# Patient Record
Sex: Male | Born: 1955 | Race: Black or African American | Hispanic: No | Smoking: Current some day smoker
Health system: Southern US, Community
[De-identification: ages and names within clinical notes are randomized; demographics above are authoritative.]

## PROBLEM LIST (undated history)

## (undated) DIAGNOSIS — T7840XA Allergy, unspecified, initial encounter: Secondary | ICD-10-CM

## (undated) DIAGNOSIS — I10 Essential (primary) hypertension: Secondary | ICD-10-CM

## (undated) DIAGNOSIS — J449 Chronic obstructive pulmonary disease, unspecified: Secondary | ICD-10-CM

## (undated) HISTORY — PX: NO PAST SURGERIES: SHX2092

## (undated) HISTORY — DX: Essential (primary) hypertension: I10

## (undated) HISTORY — DX: Chronic obstructive pulmonary disease, unspecified: J44.9

## (undated) HISTORY — DX: Allergy, unspecified, initial encounter: T78.40XA

---

## 1999-07-11 ENCOUNTER — Ambulatory Visit (HOSPITAL_BASED_OUTPATIENT_CLINIC_OR_DEPARTMENT_OTHER): Admission: RE | Admit: 1999-07-11 | Discharge: 1999-07-11 | Payer: Self-pay | Admitting: Otolaryngology

## 2004-02-25 ENCOUNTER — Emergency Department (HOSPITAL_COMMUNITY): Admission: EM | Admit: 2004-02-25 | Discharge: 2004-02-25 | Payer: Self-pay | Admitting: Emergency Medicine

## 2005-03-27 ENCOUNTER — Emergency Department (HOSPITAL_COMMUNITY): Admission: EM | Admit: 2005-03-27 | Discharge: 2005-03-27 | Payer: Self-pay | Admitting: Emergency Medicine

## 2005-04-16 ENCOUNTER — Emergency Department (HOSPITAL_COMMUNITY): Admission: EM | Admit: 2005-04-16 | Discharge: 2005-04-16 | Payer: Self-pay | Admitting: Emergency Medicine

## 2006-09-19 ENCOUNTER — Emergency Department (HOSPITAL_COMMUNITY): Admission: EM | Admit: 2006-09-19 | Discharge: 2006-09-19 | Payer: Self-pay | Admitting: Emergency Medicine

## 2006-11-05 ENCOUNTER — Emergency Department (HOSPITAL_COMMUNITY): Admission: EM | Admit: 2006-11-05 | Discharge: 2006-11-05 | Payer: Self-pay | Admitting: Emergency Medicine

## 2007-03-15 ENCOUNTER — Ambulatory Visit: Payer: Self-pay | Admitting: Family Medicine

## 2007-03-18 ENCOUNTER — Ambulatory Visit: Payer: Self-pay | Admitting: *Deleted

## 2007-09-16 ENCOUNTER — Ambulatory Visit: Payer: Self-pay | Admitting: Family Medicine

## 2008-03-30 ENCOUNTER — Ambulatory Visit: Payer: Self-pay | Admitting: Internal Medicine

## 2008-07-30 ENCOUNTER — Ambulatory Visit: Payer: Self-pay | Admitting: Family Medicine

## 2008-08-24 ENCOUNTER — Emergency Department (HOSPITAL_COMMUNITY): Admission: EM | Admit: 2008-08-24 | Discharge: 2008-08-24 | Payer: Self-pay | Admitting: Emergency Medicine

## 2008-08-24 ENCOUNTER — Emergency Department (HOSPITAL_COMMUNITY): Admission: EM | Admit: 2008-08-24 | Discharge: 2008-08-25 | Payer: Self-pay | Admitting: Emergency Medicine

## 2008-10-22 ENCOUNTER — Ambulatory Visit: Payer: Self-pay | Admitting: Family Medicine

## 2009-06-24 ENCOUNTER — Ambulatory Visit: Payer: Self-pay | Admitting: Family Medicine

## 2009-12-28 ENCOUNTER — Telehealth (INDEPENDENT_AMBULATORY_CARE_PROVIDER_SITE_OTHER): Payer: Self-pay | Admitting: *Deleted

## 2009-12-30 ENCOUNTER — Ambulatory Visit: Payer: Self-pay | Admitting: Family Medicine

## 2010-07-26 NOTE — Progress Notes (Signed)
Summary: triage/headache/body aches  Phone Note Call from Patient   Caller: Patient Reason for Call: Talk to Nurse Summary of Call: patient states he has been having chills/headache and body aches. He is not aware of being around anyone that has been sick...he has a history of asthma and has a slight cough and his inhalers are helping..he denies any nausea, vomiting diarrhea or sore throat..Advised he to take ibuprofen if not allergic. Drink plenty of fluids and get rest..Call tomorrow if no improvement or worsening s/s. Initial call taken by: Conchita Paris,  December 28, 2009 6:26 PM

## 2010-11-11 NOTE — Op Note (Signed)
[place]. [facility]  Patient:    JONH MCQUEARY                          MRN: [id number] Proc. Date: 07/11/99 Adm. Date:  [id number] Attending:  Susy Frizzle CC:         Barbette Hair. Vaughan Basta., M.D.                           Operative Report  PREOPERATIVE DIAGNOSIS:  Epidermoid cyst of the anterior neck.  POSTOPERATIVE DIAGNOSIS:  Epidermoid cyst of the anterior neck.  PROCEDURE:  Excision of epidermoid cyst of neck.  SURGEON:  Jefry H. Pollyann Kennedy, M.D.  ANESTHESIA:  General endotracheal anesthesia.  COMPLICATIONS:  None.  FINDINGS:  Firm, cystic mass at the midline anterior upper cervical area attached to the overlying skin, total area of induration approximately 3.5 cm.  Dr. Ike Bene is the referring physician.   The patient tolerated the procedure well, was awakened, extubated and transferred to recovery in stable condition.  INDICATIONS:  A 55 year old with a history of recurrent infection in what appears to be an epidermoid cyst of the anterior cervical skin.  Risks, benefits, alternatives, complications of the procedure explained to the patient, who seemed to understand and agreed to surgery.  PROCEDURE:  The patient was taken to the operating room, placed on the operating table in the supine position.  Following induction of general endotracheal anesthesia, the area was prepped and draped in the standard fashion.  The proposed incision was outlined with a marking pen and local anesthetic was infiltrated - 1% Xylocaine with epinephrine.  A 15 scalpel was used to remove a horizontal, elongated ellipse of skin encompassing all of the apparent scar tissue in the dermis.   Careful sharp dissection was then used to follow the indurated tissue  around.  The area of dissection seemed to be more anterior and inferior than the marginal branch of the facial nerve and the nerve was not identified.  Careful dissection was accomplished  surrounding the entire lesion, which was then removed and sent for pathologic evaluation.  The wound was irrigated and electrocautery was used to cauterize several small bleeding sites.  A rubberband drain was secured in place in the wound and a interrupted 5-0 Prolene suture was used to reapproximate the skin edges.  A dressing was applied.  Patient was then awakened, extubated nd transferred to recovery. DD:  07/11/99 TD:  07/11/99 Job: [id number]

## 2011-01-08 ENCOUNTER — Emergency Department (HOSPITAL_COMMUNITY)
Admission: EM | Admit: 2011-01-08 | Discharge: 2011-01-08 | Disposition: A | Discharge status: Home / Self Care | Payer: Self-pay | Attending: Emergency Medicine | Admitting: Emergency Medicine

## 2011-01-08 DIAGNOSIS — T394X1A Poisoning by antirheumatics, not elsewhere classified, accidental (unintentional), initial encounter: Secondary | ICD-10-CM | POA: Insufficient documentation

## 2011-01-08 DIAGNOSIS — T39314A Poisoning by propionic acid derivatives, undetermined, initial encounter: Secondary | ICD-10-CM | POA: Insufficient documentation

## 2011-01-08 DIAGNOSIS — K089 Disorder of teeth and supporting structures, unspecified: Secondary | ICD-10-CM | POA: Insufficient documentation

## 2011-01-08 LAB — POCT I-STAT, CHEM 8
Creatinine, Ser: 1.3 mg/dL (ref 0.50–1.35)
HCT: 44 % (ref 39.0–52.0)
Hemoglobin: 15 g/dL (ref 13.0–17.0)
Potassium: 3.9 mEq/L (ref 3.5–5.1)
Sodium: 142 mEq/L (ref 135–145)
TCO2: 30 mmol/L (ref 0–100)

## 2011-11-27 ENCOUNTER — Emergency Department (HOSPITAL_COMMUNITY)
Admission: EM | Admit: 2011-11-27 | Discharge: 2011-11-27 | Disposition: A | Discharge status: Home / Self Care | Payer: Self-pay | Source: Home / Self Care | Attending: Emergency Medicine | Admitting: Emergency Medicine

## 2011-11-27 ENCOUNTER — Encounter (HOSPITAL_COMMUNITY): Payer: Self-pay | Admitting: *Deleted

## 2011-11-27 DIAGNOSIS — A4902 Methicillin resistant Staphylococcus aureus infection, unspecified site: Secondary | ICD-10-CM

## 2011-11-27 MED ORDER — MUPIROCIN 2 % EX OINT: TOPICAL_OINTMENT | Freq: Three times a day (TID) | CUTANEOUS | Status: AC

## 2011-11-27 MED ORDER — SULFAMETHOXAZOLE-TMP DS 800-160 MG PO TABS: 2.0000 | ORAL_TABLET | Freq: Two times a day (BID) | ORAL | Status: AC

## 2011-11-27 NOTE — ED Notes (Signed)
Pt  Noticed      A few  Days  Ago  A   Lesion  On his  abd  Which  He      Thought  May  Have been a  Gaffer  -  It is  Getting  Worse  He  Reports   And  Now  Has  A  Yellow  Core     With  Redness     Present

## 2011-11-27 NOTE — ED Provider Notes (Signed)
Chief Complaint  Patient presents with  . Recurrent Skin Infections    History of Present Illness:   The patient is a 56 year old male who has had a five-day history of a tender, swollen, blistered placed in the left upper quadrant of his abdomen. He attributed this to an insect bite, but did not see anything biting or stinging him. It's tender to touch. He denies any fever or chills. There's been no drainage. Never had anything like this before. He has no history of MRSA or diabetes.  Review of Systems:  Other than noted above, the patient denies any of the following symptoms: Systemic:  No fever, chills, sweats, weight loss, or fatigue. ENT:  No nasal congestion, rhinorrhea, sore throat, swelling of lips, tongue or throat. Resp:  No cough, wheezing, or shortness of breath. Skin:  No rash, itching, nodules, or suspicious lesions.  PMFSH:  Past medical history, family history, social history, meds, and allergies were reviewed.  Physical Exam:   Vital signs:  BP 153/86  Pulse 89  Temp(Src) 98.3 F (36.8 C) (Oral)  Resp 16  SpO2 100% Gen:  Alert, oriented, in no distress. ENT:  Pharynx clear, no intraoral lesions, moist mucous membranes. Lungs:  Clear to auscultation. Skin:  There was a 2 cm x 2 cm raised, red papule on the left upper quadrant of his abdomen with a small pustule overlying it. There was no fluctuance.  Other Labs Obtained at Urgent Care Center:  The pustule was cultured.  Results are pending at this time and we will call about any positive results.  Assessment:  The encounter diagnosis was MRSA infection (methicillin-resistant Staphylococcus aureus).  Plan:   1.  The following meds were prescribed:   New Prescriptions   MUPIROCIN OINTMENT (BACTROBAN) 2 %    Apply topically 3 (three) times daily.   SULFAMETHOXAZOLE-TRIMETHOPRIM (BACTRIM DS) 800-160 MG PER TABLET    Take 2 tablets by mouth 2 (two) times daily.   2.  The patient was instructed in symptomatic care and  handouts were given. 3.  The patient was told to return if becoming worse in any way, if no better in 3 or 4 days, and given some red flag symptoms that would indicate earlier return.     Reuben Likes, MD 11/27/11 2127

## 2011-11-27 NOTE — Discharge Instructions (Signed)
Community-Associated MRSA CA-MRSA stands for community-associated methicillin-resistant Staphylococcus aureus. MRSA is a type of bacteria that is resistant to some common antibiotics. It can cause infections in the skin and many other places in the body. Staphylococcus aureus, often called "staph," is a bacteria that normally lives on the skin or in the nose. Staph on the surface of the skin or in the nose does not cause problems. However, if the staph enters the body through a cut, wound, or break in the skin, an infection can happen. Up until recently, infections with the MRSA type of staph mainly occurred in hospitals and other healthcare settings. There are now increasing problems with MRSA infections in the community as well. Infections with MRSA may be very serious or even life-threatening. CA-MRSA is becoming more common. It is known to spread in crowded settings, in jails and prisons, and in situations where there is close skin-to-skin contact, such as during sporting events or in locker rooms. MRSA can be spread through shared items, such as children's toys, razors, towels, or sports equipment.  CAUSES All staph, including MRSA, are normally harmless unless they enter the body through a scratch, cut, or wound, such as with surgery. All staph, including MRSA, can be spread from person-to-person by touching contaminated objects or through direct contact.  MRSA now causes illness in people who have not been in hospitals or other healthcare facilities. Cases of MRSA diseases in the community have been associated with:   Recent antibiotic use.   Sharing contaminated towels or clothes.   Having active skin diseases.   Participating in contact sports.   Living in crowded settings.   Intravenous (IV) drug use.   Community-associated MRSA infections are usually skin infections, but may cause other severe illnesses.   Staph bacteria are one of the most common causes of skin infection. However,  they are also a common cause of pneumonia, bone or joint infections, and bloodstream infections.  DIAGNOSIS Diagnosis of MRSA is done by cultures of fluid samples that may come from:  Swabs taken from cuts or wounds in infected areas.   Nasal swabs.   Saliva or deep cough specimens from the lungs (sputum).   Urine.   Blood.  Many people are "colonized" with MRSA but have no signs of infection. This means that people carry the MRSA germ on their skin or in their nose and may never develop MRSA infection.  TREATMENT  Treatment varies and is based on how serious, how deep, or how extensive the infection is. For example:  Some skin infections, such as a small boil or abscess, may be treated by draining yellowish-white fluid (pus) from the site of the infection.   Deeper or more widespread soft tissue infections are usually treated with surgery to drain pus and with antibiotic medicine given by vein or by mouth. This may be recommended even if you are pregnant.   Serious infections may require a hospital stay.  If antibiotics are given, they may be needed for several weeks. PREVENTION Because many people are colonized with staph, including MRSA, preventing the spread of the bacteria from person-to-person is most important. The best way to prevent the spread of bacteria and other germs is through proper hand washing or by using alcohol-based hand disinfectants. The following are other ways to help prevent MRSA infection within community settings.   Wash your hands frequently with soap and water for at least 15 seconds. Otherwise, use alcohol-based hand disinfectants when soap and water is not available.     Make sure people who live with you wash their hands often, too.   Do not share personal items. For example, avoid sharing razors and other personal hygiene items, towels, clothing, and athletic equipment.   Wash and dry your clothes and bedding at the warmest temperatures recommended on  the labels.   Keep wounds covered. Pus from infected sores may contain MRSA and other bacteria. Keep cuts and abrasions clean and covered with germ-free (sterile), dry bandages until they are healed.   If you have a wound that appears infected, ask your caregiver if a culture for MRSA and other bacteria should be done.   If you are breastfeeding, talk to your caregiver about MRSA. You may be asked to temporarily stop breastfeeding.  HOME CARE INSTRUCTIONS   Take your antibiotics as directed. Finish them even if you start to feel better.   Avoid close contact with those around you as much as possible. Do not use towels, razors, toothbrushes, bedding, or other items that will be used by others.   To fight the infection, follow your caregiver's instructions for wound care. Wash your hands before and after changing your bandages.   If you have an intravascular device, such as a catheter, make sure you know how to care for it.   Be sure to tell any healthcare providers that you have MRSA so they are aware of your infection.  SEEK IMMEDIATE MEDICAL CARE IF:  The infection appears to be getting worse. Signs include:   Increased warmth, redness, or tenderness around the wound site.   A red line that extends from the infection site.   A dark color in the area around the infection.   Wound drainage that is tan, yellow, or green.   A bad smell coming from the wound.   You feel sick to your stomach (nauseous) and throw up (vomit) or cannot keep medicine down.   You have a fever.   Your baby is older than 3 months with a rectal temperature of 102 F (38.9 C) or higher.   Your baby is 3 months old or younger with a rectal temperature of 100.4 F (38 C) or higher.   You have difficulty breathing.  MAKE SURE YOU:   Understand these instructions.   Will watch your condition.   Will get help right away if you are not doing well or get worse.  Document Released: 09/15/2005 Document  Revised: 06/01/2011 Document Reviewed: 09/15/2010 Exit[employer] Patient Information 2012 [employer]. 

## 2011-11-30 LAB — WOUND CULTURE: Gram Stain: NONE SEEN

## 2012-02-27 ENCOUNTER — Telehealth: Payer: Self-pay

## 2012-02-27 NOTE — Telephone Encounter (Signed)
Records received. Patient notified.

## 2012-02-27 NOTE — Telephone Encounter (Signed)
Pt is new to [facility] and has appt with dr Neva Seat set up on 03/15/12. Requested other office to send his records for our records and is checkinf status if we have received them.  [phone]  bf

## 2012-03-01 ENCOUNTER — Ambulatory Visit: Payer: Self-pay | Admitting: Emergency Medicine

## 2012-03-01 VITALS — BP 162/94 | HR 79 | Temp 97.5°F | Resp 16 | Ht 68.0 in | Wt 182.4 lb

## 2012-03-01 DIAGNOSIS — I10 Essential (primary) hypertension: Secondary | ICD-10-CM

## 2012-03-01 DIAGNOSIS — F411 Generalized anxiety disorder: Secondary | ICD-10-CM

## 2012-03-01 DIAGNOSIS — G4700 Insomnia, unspecified: Secondary | ICD-10-CM

## 2012-03-01 LAB — COMPREHENSIVE METABOLIC PANEL
ALT: 15 U/L (ref 0–53)
AST: 20 U/L (ref 0–37)
CO2: 29 mEq/L (ref 19–32)
Calcium: 10 mg/dL (ref 8.4–10.5)
Chloride: 102 mEq/L (ref 96–112)
Creat: 1.23 mg/dL (ref 0.50–1.35)
Sodium: 139 mEq/L (ref 135–145)
Total Bilirubin: 0.5 mg/dL (ref 0.3–1.2)
Total Protein: 7.1 g/dL (ref 6.0–8.3)

## 2012-03-01 LAB — POCT CBC
Granulocyte percent: 46.5 %G (ref 37–80)
Hemoglobin: 14.9 g/dL (ref 14.1–18.1)
MCH, POC: 32.8 pg — AB (ref 27–31.2)
MID (cbc): 0.3 (ref 0–0.9)
MPV: 8.9 fL (ref 0–99.8)
POC Granulocyte: 1.8 — AB (ref 2–6.9)
POC MID %: 7.3 %M (ref 0–12)
Platelet Count, POC: 246 10*3/uL (ref 142–424)
RBC: 4.54 M/uL — AB (ref 4.69–6.13)

## 2012-03-01 LAB — LIPID PANEL
Cholesterol: 197 mg/dL (ref 0–200)
Total CHOL/HDL Ratio: 4.2 Ratio
VLDL: 19 mg/dL (ref 0–40)

## 2012-03-01 LAB — PSA: PSA: 0.57 ng/mL (ref <=4.00)

## 2012-03-01 MED ORDER — LOSARTAN POTASSIUM 50 MG PO TABS: 50.0000 mg | ORAL_TABLET | Freq: Every day | ORAL | Status: DC

## 2012-03-01 MED ORDER — MONTELUKAST SODIUM 10 MG PO TABS: 10.0000 mg | ORAL_TABLET | Freq: Every day | ORAL | Status: DC

## 2012-03-01 MED ORDER — CLONAZEPAM 0.5 MG PO TABS: 0.2500 mg | ORAL_TABLET | Freq: Every evening | ORAL | Status: DC | PRN

## 2012-03-01 NOTE — Progress Notes (Signed)
  Date:  03/01/2012   Name:  Joshua Powell   DOB:  11/19/1955   MRN:  [id number] Gender: male Age: 56 y.o.  PCP:  No primary provider on file.    Chief Complaint: Hypertension, Insomnia and Anxiety   History of Present Illness:  Joshua Powell is a 56 y.o. pleasant patient who presents with the following:  Monitoring blood pressure at drug store past few weeks and has found it to be consistently elevated.  Also has history of asthma requiring daily inhaler use.  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  Says is very stressed by that responsibility and the stress of stopping smoking.  He is having difficulty sleeping as a consequence.    There is no problem list on file for this patient.   Past Medical History  Diagnosis Date  . Asthma     No past surgical history on file.  History  Substance Use Topics  . Smoking status: Former Smoker    Types: Cigarettes    Quit date: 11/30/2010  . Smokeless tobacco: Not on file  . Alcohol Use: Not on file    Family History  Problem Relation Age of Onset  . Hypertension Other     No Known Allergies  Medication list has been reviewed and updated.  Current Outpatient Prescriptions on File Prior to Visit  Medication Sig Dispense Refill  . albuterol (PROVENTIL HFA;VENTOLIN HFA) 108 (90 BASE) MCG/ACT inhaler Inhale 2 puffs into the lungs every 6 (six) hours as needed.      . budesonide-formoterol (SYMBICORT) 160-4.5 MCG/ACT inhaler Inhale 2 puffs into the lungs 2 (two) times daily.      . clonazePAM (KLONOPIN) 0.5 MG tablet Take 0.5 mg by mouth. Takes 1/2 tablet QD PRN      . montelukast (SINGULAIR) 10 MG tablet Take 10 mg by mouth every morning.       . tadalafil (CIALIS) 5 MG tablet Take 5 mg by mouth daily as needed.      . tiotropium (SPIRIVA) 18 MCG inhalation capsule Place 18 mcg into inhaler and inhale daily.      Marland Kitchen triamcinolone (NASACORT) 55 MCG/ACT nasal inhaler Place 2 sprays into the nose daily.        Review of  Systems:  As per HPI, otherwise negative.    Physical Examination: Filed Vitals:   03/01/12 1021  BP: 162/94  Pulse: 79  Temp: 97.5 F (36.4 C)  Resp: 16   Filed Vitals:   03/01/12 1021  Height: 5\' 8"  (1.727 m)  Weight: 182 lb 6.4 oz (82.736 kg)   Body mass index is 27.73 kg/(m^2). Ideal Body Weight: Weight in (lb) to have BMI = 25: 164.1   GEN: WDWN, NAD, Non-toxic, A & O x 3 HEENT: Atraumatic, Normocephalic. Neck supple. No masses, No LAD.  Oropharynx negative.  Fundi benign Ears and Nose: No external deformity. TM obscured by cerumen Neck No thyromegaly or masses CV: RRR, No M/G/R. No JVD. No thrill. No extra heart sounds. PULM: CTA B, no wheezes, crackles, rhonchi. No retractions. No resp. distress. No accessory muscle use. ABD: S, NT, ND, +BS. No rebound. No HSM. EXTR: No c/c/e NEURO Normal gait.  PSYCH: Normally interactive. Conversant. Not depressed or anxious appearing.  Calm demeanor.    Assessment and Plan: hypertension Anxiety insomnia Labs Follow up next week with Dr Chilton Si  Cozaar 50 mg Carmelina Dane, MD

## 2012-03-01 NOTE — Addendum Note (Signed)
Addended by: Carmelina Dane on: 03/01/2012 11:01 AM   Modules accepted: Orders

## 2012-03-15 ENCOUNTER — Encounter: Payer: Self-pay | Admitting: Family Medicine

## 2012-04-04 ENCOUNTER — Other Ambulatory Visit: Payer: Self-pay | Admitting: Emergency Medicine

## 2012-04-05 ENCOUNTER — Encounter: Payer: Self-pay | Admitting: Family Medicine

## 2012-05-02 ENCOUNTER — Telehealth: Payer: Self-pay

## 2012-05-02 NOTE — Telephone Encounter (Signed)
This is a patient of Dr Clelia Croft.  Pt had to cancel his appointment with Dr Clelia Croft for 05/03/12, because he can't afford to pay. Pt is wanting to know if Dr Clelia Croft can call in a refill to [facility] [address] for "anxiety medication".  Also, Dr Clelia Croft had signed paperwork with [employer] for patient assistance with cymbalta, inhaler for asthma and cialis and patient needs to get those forms signed again. Wants to know if dr Clelia Croft can do this without an office visit. Please call pt to advise.

## 2012-05-03 ENCOUNTER — Ambulatory Visit: Payer: Self-pay | Admitting: Family Medicine

## 2012-05-03 NOTE — Telephone Encounter (Signed)
Unfortunately, I cannot prescribe a pt medication without them being established with the practice as medications need monitoring and documentation.  I understand that the cost is a problem however I could lose my license if something bad happened.  I would recommend seeing if he can bring the forms in to the [facility] as they will see pt's using the orange card or establishing with [facility].  I do not even have access to pt's chart anymore and so don't know what medications he was on, doses, etc.

## 2012-05-05 NOTE — Telephone Encounter (Signed)
lmom to cb. 

## 2012-05-06 NOTE — Telephone Encounter (Signed)
Called patient to advise  °

## 2012-05-07 ENCOUNTER — Other Ambulatory Visit: Payer: Self-pay | Admitting: Physician Assistant

## 2012-05-07 ENCOUNTER — Other Ambulatory Visit: Payer: Self-pay | Admitting: Radiology

## 2012-05-29 ENCOUNTER — Ambulatory Visit: Payer: Self-pay | Admitting: Family Medicine

## 2012-05-29 VITALS — BP 136/84 | HR 103 | Temp 98.0°F | Resp 17 | Ht 68.0 in | Wt 179.0 lb

## 2012-05-29 DIAGNOSIS — I10 Essential (primary) hypertension: Secondary | ICD-10-CM

## 2012-05-29 DIAGNOSIS — J45909 Unspecified asthma, uncomplicated: Secondary | ICD-10-CM

## 2012-05-29 DIAGNOSIS — N529 Male erectile dysfunction, unspecified: Secondary | ICD-10-CM

## 2012-05-29 MED ORDER — BUDESONIDE-FORMOTEROL FUMARATE 160-4.5 MCG/ACT IN AERO: 2.0000 | INHALATION_SPRAY | Freq: Two times a day (BID) | RESPIRATORY_TRACT | Status: DC

## 2012-05-29 NOTE — Patient Instructions (Signed)
We will work on your other forms tomorrow, but the Symbicort was faxed today.  Continue to take 1/2 of Cozaar 50mg  (25mg  dose). Recheck in next 3 months for bloodwork and discussion of medicines.  Return to the clinic or go to the nearest emergency room if any of your symptoms worsen or new symptoms occur.

## 2012-05-29 NOTE — Progress Notes (Signed)
Subjective:    Patient ID: Joshua Powell, male    DOB: [birth year], 56 y.o.   MRN: [id number]  HPI Joshua Powell is a 56 y.o. male  Prior patient of [facility] - seen by Dr. Clelia Croft while at [facility]. New patient to me. meds through patient assistance programs. Programs running out tomorrow.  Was not able to make appt with me in September.   Asthma - taking symbicort - 2 puffs twice per day.  Prior smoker - stopped smoking about a year ago.  Had been put on spiriva for breathing - 1 cap qd, not taking singulair anymore, albuterol if needed - not needing recently - about 3 to 4 times per week at the most. Feels like asthma controlled. No missed doses of meds.  Has refill of spiriva.  ED - 5mg  daily dose at night. Working well.  No chest pains, no lightheadedness or dizziness on these meds.   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 Anxiety at times with this and trying not to smoke again. Taking Klonopin once at night.   HTN - lethargic if taking full 50mg  pill of cozaar, taking 1/2 pill - home BP's - 111/68, 110/60.  Able to split tablets in half without difficulty.   Not needing Nasocort for allergies at present - spring and fall usually.    Results for orders placed in visit on 03/01/12  POCT CBC      Component Value Range   WBC 3.9 (*) 4.6 - 10.2 K/uL   Lymph, poc 1.8  0.6 - 3.4   POC LYMPH PERCENT 46.2  10 - 50 %L   MID (cbc) 0.3  0 - 0.9   POC MID % 7.3  0 - 12 %M   POC Granulocyte 1.8 (*) 2 - 6.9   Granulocyte percent 46.5  37 - 80 %G   RBC 4.54 (*) 4.69 - 6.13 M/uL   Hemoglobin 14.9  14.1 - 18.1 g/dL   HCT, POC 95.2  84.1 - 53.7 %   MCV 103.7 (*) 80 - 97 fL   MCH, POC 32.8 (*) 27 - 31.2 pg   MCHC 31.6 (*) 31.8 - 35.4 g/dL   RDW, POC 32.4     Platelet Count, POC 246  142 - 424 K/uL   MPV 8.9  0 - 99.8 fL  COMPREHENSIVE METABOLIC PANEL      Component Value Range   Sodium 139  135 - 145 mEq/L   Potassium 4.6  3.5 - 5.3 mEq/L   Chloride 102  96 - 112 mEq/L   CO2 29  19 - 32  mEq/L   Glucose, Bld 99  70 - 99 mg/dL   BUN 8  6 - 23 mg/dL   Creat 4.01  0.27 - 2.53 mg/dL   Total Bilirubin 0.5  0.3 - 1.2 mg/dL   Alkaline Phosphatase 54  39 - 117 U/L   AST 20  0 - 37 U/L   ALT 15  0 - 53 U/L   Total Protein 7.1  6.0 - 8.3 g/dL   Albumin 4.7  3.5 - 5.2 g/dL   Calcium 66.4  8.4 - 40.3 mg/dL  LIPID PANEL      Component Value Range   Cholesterol 197  0 - 200 mg/dL   Triglycerides 93  <474 mg/dL   HDL 47  >25 mg/dL   Total CHOL/HDL Ratio 4.2     VLDL 19  0 - 40 mg/dL   LDL Cholesterol 956 (*) 0 - 99  mg/dL  PSA      Component Value Range   PSA 0.57  <=4.00 ng/mL     Review of Systems  Constitutional: Negative for fatigue and unexpected weight change.  Eyes: Negative for visual disturbance.  Respiratory: Negative for cough, chest tightness and shortness of breath.   Cardiovascular: Negative for chest pain, palpitations and leg swelling.  Gastrointestinal: Negative for abdominal pain and blood in stool.  Neurological: Negative for dizziness, light-headedness and headaches.       Objective:   Physical Exam  Constitutional: He is oriented to person, place, and time. He appears well-developed and well-nourished.  HENT:  Head: Normocephalic and atraumatic.  Eyes: EOM are normal. Pupils are equal, round, and reactive to light.  Neck: No JVD present. Carotid bruit is not present.  Cardiovascular: Normal rate, regular rhythm and normal heart sounds.   No murmur heard. Pulmonary/Chest: Effort normal and breath sounds normal. He has no rales.  Musculoskeletal: He exhibits no edema.  Neurological: He is alert and oriented to person, place, and time.  Skin: Skin is warm and dry.  Psychiatric: He has a normal mood and affect.       Assessment & Plan:  Joshua Powell is a 56 y.o. male  1. Asthma  budesonide-formoterol (SYMBICORT) 160-4.5 MCG/ACT inhaler  2. HTN (hypertension)    3. ED (erectile dysfunction)     HTN - controlled on 25mg  Cozaar dose.    Asthma - controlled.  Faxed # 3 of Symbicort today - has other paperwork to be completed that staff will work on tomorrow.  Will continue same meds, including to only take 1/2 of 50mg  Cozaar.   ED - stable - will refill meds for 90 days.   Plan on recheck in 3 months.   Patient Instructions  We will work on your other forms tomorrow, but the Symbicort was faxed today.  Continue to take 1/2 of Cozaar 50mg  (25mg  dose). Recheck in next 3 months for bloodwork and discussion of medicines.  Return to the clinic or go to the nearest emergency room if any of your symptoms worsen or new symptoms occur.

## 2012-05-30 ENCOUNTER — Other Ambulatory Visit: Payer: Self-pay | Admitting: Radiology

## 2012-05-30 DIAGNOSIS — N529 Male erectile dysfunction, unspecified: Secondary | ICD-10-CM

## 2012-05-30 DIAGNOSIS — J45909 Unspecified asthma, uncomplicated: Secondary | ICD-10-CM

## 2012-05-30 MED ORDER — ALBUTEROL SULFATE HFA 108 (90 BASE) MCG/ACT IN AERS: 2.0000 | INHALATION_SPRAY | RESPIRATORY_TRACT | Status: DC | PRN

## 2012-05-30 MED ORDER — TADALAFIL 5 MG PO TABS: 5.0000 mg | ORAL_TABLET | Freq: Every day | ORAL | Status: DC | PRN

## 2012-05-30 NOTE — Telephone Encounter (Signed)
Dr Neva Seat, I have completed the forms for Joshua Powell, there are 2 you have to sign, I need to send in Rx for him, I have pended them so they will print.one of the medications is Cialis, he is asking for 4 month supply, how many do you want him to have on this medication? You also have to sign the Rx's for him, so they can be sent with the forms. Thanks Amy

## 2012-05-30 NOTE — Telephone Encounter (Signed)
Joshua Powell,   Patient saw Dr. Neva Seat yesterday and left some paperwork for assistance program with him.  Dr. Neva Seat advised patient to get  In touch with you today to follow up.  Please call [phone]

## 2012-06-10 ENCOUNTER — Telehealth: Payer: Self-pay | Admitting: Radiology

## 2012-06-10 DIAGNOSIS — F419 Anxiety disorder, unspecified: Secondary | ICD-10-CM

## 2012-06-10 MED ORDER — CLONAZEPAM 0.5 MG PO TABS: 0.2500 mg | ORAL_TABLET | Freq: Two times a day (BID) | ORAL | Status: DC | PRN

## 2012-06-10 NOTE — Telephone Encounter (Signed)
Med prescribed - can be faxed to pharmacy on record.   Also - patient had question about the Symbicort rx form last ov and paperwork sent to [employer] - they have apparently not received this. Amy - can you help me determine what needs to be done for this - Thanks.

## 2012-06-10 NOTE — Telephone Encounter (Signed)
Called pt - taking Klonopin 1/2 tab BID prn for anxiety and insomnia, discussed this at last ov. Will send in Rx to reflect this, #30, 1 refill.

## 2012-06-10 NOTE — Telephone Encounter (Signed)
Have gotten fax from [facility] about patients Klonopin 0.5mg  tablet, please advise on renewal, sig indicates 1/2 tablet at bedtime so patient should have enough. I also do not see any mention of this in your recent OV note.

## 2012-08-13 ENCOUNTER — Other Ambulatory Visit: Payer: Self-pay | Admitting: Family Medicine

## 2012-08-13 DIAGNOSIS — F418 Other specified anxiety disorders: Secondary | ICD-10-CM

## 2012-08-14 NOTE — Telephone Encounter (Signed)
Refill completed, but should be due for office visit in March. Please call to advise.

## 2012-08-27 ENCOUNTER — Telehealth: Payer: Self-pay

## 2012-08-27 DIAGNOSIS — J45909 Unspecified asthma, uncomplicated: Secondary | ICD-10-CM

## 2012-08-27 MED ORDER — BUDESONIDE-FORMOTEROL FUMARATE 160-4.5 MCG/ACT IN AERO: 2.0000 | INHALATION_SPRAY | Freq: Two times a day (BID) | RESPIRATORY_TRACT | Status: DC

## 2012-08-27 MED ORDER — TIOTROPIUM BROMIDE MONOHYDRATE 18 MCG IN CAPS: 18.0000 ug | ORAL_CAPSULE | Freq: Every day | RESPIRATORY_TRACT | Status: DC

## 2012-08-27 NOTE — Telephone Encounter (Signed)
PT STATES HE IS DR GREENE'S PT AND WOULD LIKE AMY TO GIVE HIM A CALL BACK. DIDN'T WANT TO SAY WHAT IT WAS ABOUT PLEASE CALL [phone]

## 2012-08-27 NOTE — Telephone Encounter (Signed)
Noted.  We can send in paperwork for these two meds for the patient assistance programs with refills as requested, but usually reassess control in 6 months. We had discussed 3 month follow up at the December office visi, butcan stretch this out for a few more months if this is needed, but would recommend OV by July. Let me know if there are questions, or if I still need to call.  Also - printed the two prescriptions so they can be faxed.

## 2012-08-27 NOTE — Telephone Encounter (Signed)
Spoke with pt, pt is frustrated because he doesn't have insurance. He needs Dr Neva Seat to send him in a 90 day supply with 3 refills of his symbicort and spiriva. Please fax to [employer]. For the symbicort the number is [phone] patient assistance ID [id number]. For the Spiriva the fax number [phone]. Pt states this is the only meds that control his asthma and the patient assistance program really needs this to be filled up to a year to help him with the expenses. Please advise.

## 2012-08-29 NOTE — Telephone Encounter (Signed)
Faxed these to the numbers given. Called patient to advise. He is angry this has taken 2 days to be done. He asked if I am racist. I advised him I do not even know what race he is, I have only spoken to him by phone.

## 2012-09-02 ENCOUNTER — Telehealth: Payer: Self-pay

## 2012-09-02 NOTE — Telephone Encounter (Signed)
PT STATES WE FAXED OVER A FROM FOR THE PT ASSISTANCE PROGRAM BUT IT DIDN'T HAVE A COVER SHEET ON IT AND IT IS NOT BEING ACCEPTED. PLEASE CALL [phone] AND GIVE HIS NAME AND DOB. IS IN NEED OF HIS SPIRIVA TO BE REFILLED FOR ONE YEAR. YOU MAY REACH PT AT [phone]

## 2012-09-03 NOTE — Telephone Encounter (Signed)
Patients forms are fine,they called to verify I had indeed sent them the information, I verified I did send this on his behalf.

## 2012-09-26 ENCOUNTER — Other Ambulatory Visit: Payer: Self-pay | Admitting: Emergency Medicine

## 2012-10-01 ENCOUNTER — Emergency Department (HOSPITAL_COMMUNITY)
Admission: EM | Admit: 2012-10-01 | Discharge: 2012-10-01 | Disposition: A | Discharge status: Home / Self Care | Payer: No Typology Code available for payment source | Source: Home / Self Care | Attending: Family Medicine | Admitting: Family Medicine

## 2012-10-01 ENCOUNTER — Encounter (HOSPITAL_COMMUNITY): Payer: Self-pay

## 2012-10-01 DIAGNOSIS — G4700 Insomnia, unspecified: Secondary | ICD-10-CM | POA: Diagnosis present

## 2012-10-01 DIAGNOSIS — J449 Chronic obstructive pulmonary disease, unspecified: Secondary | ICD-10-CM | POA: Diagnosis present

## 2012-10-01 DIAGNOSIS — F419 Anxiety disorder, unspecified: Secondary | ICD-10-CM

## 2012-10-01 DIAGNOSIS — I10 Essential (primary) hypertension: Secondary | ICD-10-CM | POA: Diagnosis present

## 2012-10-01 DIAGNOSIS — F418 Other specified anxiety disorders: Secondary | ICD-10-CM

## 2012-10-01 DIAGNOSIS — F411 Generalized anxiety disorder: Secondary | ICD-10-CM

## 2012-10-01 DIAGNOSIS — Z720 Tobacco use: Secondary | ICD-10-CM

## 2012-10-01 DIAGNOSIS — J45909 Unspecified asthma, uncomplicated: Secondary | ICD-10-CM | POA: Diagnosis present

## 2012-10-01 LAB — CBC
MCV: 94.4 fL (ref 78.0–100.0)
Platelets: 222 10*3/uL (ref 150–400)
RBC: 4.45 MIL/uL (ref 4.22–5.81)
RDW: 11.7 % (ref 11.5–15.5)
WBC: 4 10*3/uL (ref 4.0–10.5)

## 2012-10-01 LAB — COMPREHENSIVE METABOLIC PANEL
Albumin: 4.2 g/dL (ref 3.5–5.2)
Alkaline Phosphatase: 66 U/L (ref 39–117)
BUN: 7 mg/dL (ref 6–23)
CO2: 29 mEq/L (ref 19–32)
Chloride: 101 mEq/L (ref 96–112)
Creatinine, Ser: 1.15 mg/dL (ref 0.50–1.35)
GFR calc Af Amer: 81 mL/min — ABNORMAL LOW (ref >90)
GFR calc non Af Amer: 70 mL/min — ABNORMAL LOW (ref >90)
Glucose, Bld: 87 mg/dL (ref 70–99)
Potassium: 4.2 mEq/L (ref 3.5–5.1)
Total Bilirubin: 0.6 mg/dL (ref 0.3–1.2)

## 2012-10-01 LAB — LIPID PANEL
Cholesterol: 204 mg/dL — ABNORMAL HIGH (ref 0–200)
Triglycerides: 67 mg/dL (ref <150)

## 2012-10-01 MED ORDER — LOSARTAN POTASSIUM-HCTZ 100-12.5 MG PO TABS: 1.0000 | ORAL_TABLET | Freq: Every day | ORAL | Status: DC

## 2012-10-01 MED ORDER — ALBUTEROL SULFATE HFA 108 (90 BASE) MCG/ACT IN AERS: 2.0000 | INHALATION_SPRAY | Freq: Four times a day (QID) | RESPIRATORY_TRACT | Status: DC | PRN

## 2012-10-01 MED ORDER — TRAZODONE HCL 50 MG PO TABS: 50.0000 mg | ORAL_TABLET | Freq: Every evening | ORAL | Status: DC | PRN

## 2012-10-01 MED ORDER — CLONAZEPAM 0.5 MG PO TABS: 0.2500 mg | ORAL_TABLET | Freq: Two times a day (BID) | ORAL | Status: DC | PRN

## 2012-10-01 NOTE — ED Notes (Signed)
Patient here to establish care History of Asthma, hypertension and insomnia

## 2012-10-01 NOTE — ED Provider Notes (Signed)
History     CSN: [id number]  Arrival date & time 10/01/12  1507   First MD Initiated Contact with Patient 10/01/12 1525      Chief Complaint  Patient presents with  . Asthma  . Hypertension   HPI Pt reports that he has been having headaches and reports that he doubled up on his losartan medication and had been taking 100 mg po daily.  Pt has been having anxiety and has been using clonazepam prn.  His symptoms have improved on the increased dose of the losartan.  Pt is having a lot of anxiety and not sleeping well at night and asking for something to help him sleep.  Pt has asthma.    Past Medical History  Diagnosis Date  . Asthma   . Hypertension     History reviewed. No pertinent past surgical history.  Family History  Problem Relation Age of Onset  . Hypertension Other   . Heart disease Father     History  Substance Use Topics  . Smoking status: Former Smoker    Types: Cigarettes    Quit date: 11/30/2010  . Smokeless tobacco: Not on file  . Alcohol Use: No    Review of Systems Constitutional: chronic insomnia and anxiety  HENT: Negative.  Respiratory: Negative.  Cardiovascular: Negative.  Gastrointestinal: Negative.  Endocrine: Negative.  Genitourinary: Negative.  Musculoskeletal: Negative.  Skin: Negative.  Allergic/Immunologic: Negative.  Neurological: Negative.  Hematological: Negative.  Psychiatric/Behavioral: Negative.  All other systems reviewed and are negative   Allergies  Review of patient's allergies indicates no known allergies.  Home Medications   Current Outpatient Rx  Name  Route  Sig  Dispense  Refill  . aspirin 81 MG tablet   Oral   Take 81 mg by mouth daily.         Marland Kitchen ipratropium-albuterol (DUONEB) 0.5-2.5 (3) MG/3ML SOLN   Nebulization   Take 3 mLs by nebulization.         Marland Kitchen albuterol (PROVENTIL HFA;VENTOLIN HFA) 108 (90 BASE) MCG/ACT inhaler   Inhalation   Inhale 2 puffs into the lungs every 4 (four) hours as needed.  3 Inhaler   0   . budesonide-formoterol (SYMBICORT) 160-4.5 MCG/ACT inhaler   Inhalation   Inhale 2 puffs into the lungs 2 (two) times daily.   3 Inhaler   3   . clonazePAM (KLONOPIN) 0.5 MG tablet      TAKE 1/2 TABLET BY MOUTH TWICE A DAY AS NEEDED ANXIETY   30 tablet   1   . cyanocobalamin 500 MCG tablet   Oral   Take 500 mcg by mouth daily.         Marland Kitchen losartan (COZAAR) 50 MG tablet   Oral   Take 25 mg by mouth daily.         Marland Kitchen losartan (COZAAR) 50 MG tablet      TAKE 1 TABLET BY MOUTH EVERY DAY   30 tablet   2   . montelukast (SINGULAIR) 10 MG tablet   Oral   Take 1 tablet (10 mg total) by mouth at bedtime.   30 tablet   1   . Multiple Vitamin (MULTIVITAMIN) tablet   Oral   Take 1 tablet by mouth daily.         . tadalafil (CIALIS) 5 MG tablet   Oral   Take 1 tablet (5 mg total) by mouth daily as needed.   120 tablet   0   . tiotropium (SPIRIVA)  18 MCG inhalation capsule   Inhalation   Place 1 capsule (18 mcg total) into inhaler and inhale daily.   90 capsule   3   . triamcinolone (NASACORT) 55 MCG/ACT nasal inhaler   Nasal   Place 2 sprays into the nose daily.         . vitamin B-12 (CYANOCOBALAMIN) 100 MCG tablet   Oral   Take 50 mcg by mouth daily.         . vitamin E 400 UNIT capsule   Oral   Take 400 Units by mouth daily.           BP 138/78  Pulse 86  Temp(Src) 98.1 F (36.7 C) (Oral)  Resp 18  SpO2 98%  Physical Exam Nursing note and vitals reviewed.  Constitutional: He is oriented to person, place, and time. He appears well-developed and well-nourished. No distress.  Eyes: Conjunctivae and EOM are normal. Pupils are equal, round, and reactive to light.  Neck: Normal range of motion. Neck supple. No JVD present. No thyromegaly present.  Cardiovascular: Normal rate, regular rhythm and normal heart sounds.  No murmur heard.  Pulmonary/Chest: Effort normal and breath sounds normal. No respiratory distress.  Abdominal:  Soft. Bowel sounds are normal.  Musculoskeletal: Normal range of motion. He exhibits no edema.  Lymphadenopathy:  He has no cervical adenopathy.  Neurological: He is oriented to person, place, and time. Coordination normal.  Skin: Skin is warm and dry. No rash noted. No erythema. No pallor.  Psychiatric: He has a normal mood and affect. His behavior is normal. Judgment and thought content normal.   ED Course  Procedures (including critical care time)  Labs Reviewed - No data to display No results found.   No diagnosis found.  MDM  IMPRESSION  Hypertension  Anxiety Disorder  Insomnia  Asthma, controlled  COPD    RECOMMENDATIONS / PLAN Trial of trazodone 50 mg QHS Refill clonazepam to use as needed  Losartan 100/12.5 po daily Check labs today   FOLLOW UP 1 month   The patient was given clear instructions to go to ER or return to medical center if symptoms don't improve, worsen or new problems develop.  The patient verbalized understanding.  The patient was told to call to get lab results if they haven't heard anything in the next week.             Cleora Fleet, MD 10/01/12 1538

## 2012-10-02 ENCOUNTER — Telehealth (HOSPITAL_COMMUNITY): Payer: Self-pay

## 2012-10-02 LAB — HEMOGLOBIN A1C: Hgb A1c MFr Bld: 4.8 % (ref <5.7)

## 2012-10-02 NOTE — ED Notes (Signed)
Lab results given

## 2012-10-02 NOTE — Progress Notes (Signed)
Quick Note:  Please inform patient that his labs came back stable. Recheck in 4 months  [name], MD, CDE, FAAFP [facility] [place], [place]   ______

## 2012-10-11 ENCOUNTER — Other Ambulatory Visit: Payer: Self-pay | Admitting: Family Medicine

## 2012-10-25 ENCOUNTER — Telehealth: Payer: Self-pay

## 2012-10-25 DIAGNOSIS — F418 Other specified anxiety disorders: Secondary | ICD-10-CM

## 2012-10-25 NOTE — Telephone Encounter (Signed)
PT WOULD LIKE A REFILL ON CLONAZEPAM, PT SATES THAT HE IS UNABLE TO COME IN BECAUSE OF FINANCES. BEST# [phone]

## 2012-10-29 MED ORDER — CLONAZEPAM 0.5 MG PO TABS: 0.2500 mg | ORAL_TABLET | Freq: Two times a day (BID) | ORAL | Status: DC | PRN

## 2012-10-29 NOTE — Telephone Encounter (Signed)
Thanks, I have advised patient of need for office visit before this runs out.

## 2012-10-29 NOTE — Telephone Encounter (Signed)
I can refill this medicine once without office visit, but the plan at the December office visit was to follow up in 3 months, and I have not seen him other than that initial office visit. We did try to stretch out the follow up time to sometime in June, but additionally he was started on another medicine at the emergency room eval a month ago, so follow up needed to discuss efficacy of this medicine. If cost prohibitive we can look into other options including behavioral health if this would be easier for him.  Otherwise needs ov in next months to discuss meds.

## 2012-10-30 ENCOUNTER — Emergency Department (HOSPITAL_COMMUNITY)
Admission: EM | Admit: 2012-10-30 | Discharge: 2012-10-30 | Disposition: A | Discharge status: Home / Self Care | Payer: No Typology Code available for payment source | Source: Home / Self Care

## 2012-10-30 ENCOUNTER — Encounter (HOSPITAL_COMMUNITY): Payer: Self-pay

## 2012-10-30 DIAGNOSIS — F419 Anxiety disorder, unspecified: Secondary | ICD-10-CM

## 2012-10-30 DIAGNOSIS — L739 Follicular disorder, unspecified: Secondary | ICD-10-CM

## 2012-10-30 MED ORDER — MUPIROCIN 2 % EX OINT: TOPICAL_OINTMENT | Freq: Three times a day (TID) | CUTANEOUS | Status: DC

## 2012-10-30 MED ORDER — DOXYCYCLINE HYCLATE 50 MG PO CAPS: 50.0000 mg | ORAL_CAPSULE | Freq: Two times a day (BID) | ORAL | Status: AC

## 2012-10-30 MED ORDER — CARBAMIDE PEROXIDE 10 % MT SOLN: 1.0000 "application " | Freq: Three times a day (TID) | OROMUCOSAL | Status: DC | PRN

## 2012-10-30 NOTE — ED Notes (Signed)
Patient states has some kind of rash on his abd Has been there for 4 days

## 2012-10-30 NOTE — ED Provider Notes (Signed)
History     CSN: [id number]  Arrival date & time 10/30/12  1558   None     Chief Complaint  Patient presents with  . Rash   57 year old male with a history of hypertension, anxiety disorder who is here at urgent care clinic for an area of erythema measuring 2 cm x 3 cm on his abdominal wall to the left of the umbilicus. The area started as a small follicle that was infected and slowly started spreading. It is oozing slightly. The patient denies any fever at home. The patient has not used any topical creams except mupirocin which he  had from last year. He is also requesting a refill on his Xanax. I see that the patient's medication was refilled on 10/29/12 electronically   HPI  Past Medical History  Diagnosis Date  . Asthma   . Hypertension     History reviewed. No pertinent past surgical history.  Family History  Problem Relation Age of Onset  . Hypertension Other   . Heart disease Father     History  Substance Use Topics  . Smoking status: Former Smoker    Types: Cigarettes    Quit date: 11/30/2010  . Smokeless tobacco: Not on file  . Alcohol Use: No      Review of Systems  Allergies  Review of patient's allergies indicates no known allergies.  Home Medications   Current Outpatient Rx  Name  Route  Sig  Dispense  Refill  . albuterol (PROVENTIL HFA;VENTOLIN HFA) 108 (90 BASE) MCG/ACT inhaler   Inhalation   Inhale 2 puffs into the lungs every 6 (six) hours as needed for wheezing.   1 Inhaler   5   . aspirin 81 MG tablet   Oral   Take 81 mg by mouth daily.         . budesonide-formoterol (SYMBICORT) 160-4.5 MCG/ACT inhaler   Inhalation   Inhale 2 puffs into the lungs 2 (two) times daily.   3 Inhaler   3   . carbamide peroxide (GLY-OXIDE) 10 % solution   dental   Place 1 application onto teeth 3 (three) times daily as needed.   1 Bottle   1   . clonazePAM (KLONOPIN) 0.5 MG tablet   Oral   Take 0.5 tablets (0.25 mg total) by mouth 2 (two) times  daily as needed for anxiety.   30 tablet   0   . cyanocobalamin 500 MCG tablet   Oral   Take 500 mcg by mouth daily.         Marland Kitchen doxycycline (VIBRAMYCIN) 50 MG capsule   Oral   Take 1 capsule (50 mg total) by mouth 2 (two) times daily.   20 capsule   0   . ipratropium-albuterol (DUONEB) 0.5-2.5 (3) MG/3ML SOLN   Nebulization   Take 3 mLs by nebulization.         Marland Kitchen losartan-hydrochlorothiazide (HYZAAR) 100-12.5 MG per tablet   Oral   Take 1 tablet by mouth daily.   30 tablet   4   . montelukast (SINGULAIR) 10 MG tablet   Oral   Take 1 tablet (10 mg total) by mouth at bedtime.   30 tablet   1   . Multiple Vitamin (MULTIVITAMIN) tablet   Oral   Take 1 tablet by mouth daily.         . mupirocin ointment (BACTROBAN) 2 %   Topical   Apply topically 3 (three) times daily.   22 g  0   . tadalafil (CIALIS) 5 MG tablet   Oral   Take 1 tablet (5 mg total) by mouth daily as needed.   120 tablet   0   . tiotropium (SPIRIVA) 18 MCG inhalation capsule   Inhalation   Place 1 capsule (18 mcg total) into inhaler and inhale daily.   90 capsule   3   . traZODone (DESYREL) 50 MG tablet   Oral   Take 1 tablet (50 mg total) by mouth at bedtime as needed for sleep.   30 tablet   3   . triamcinolone (NASACORT) 55 MCG/ACT nasal inhaler   Nasal   Place 2 sprays into the nose daily.         . vitamin B-12 (CYANOCOBALAMIN) 100 MCG tablet   Oral   Take 50 mcg by mouth daily.         . vitamin E 400 UNIT capsule   Oral   Take 400 Units by mouth daily.           BP 120/78  Pulse 99  Temp(Src) 97.8 F (36.6 C) (Oral)  Resp 16  SpO2 96%  Physical Exam Cardiovascular: Normal rate, regular rhythm and normal heart sounds.  No murmur heard.  Pulmonary/Chest: Effort normal and breath sounds normal. No respiratory distress.  Abdominal: Soft. Bowel sounds are normal.  Musculoskeletal: Normal range of motion. He exhibits no edema.  Lymphadenopathy:  He has no  cervical adenopathy.  Neurological: He is oriented to person, place, and time. Coordination normal.  Skin: Skin is warm and dry. No rash noted. No erythema. No pallor.  Psychiatric: He has a normal mood and affect. His behavior is normal. Judgment and thought content normal.   ED Course  Procedures (including critical care time)  Labs Reviewed - No data to display No results found.   1. Folliculitis and prescribed doxycycline for 10 days, and a subsequent, mupirocin ointment   2. Anxiety Dr. Chilton Si prescribed Xanax on 05/06 so no refill provided       MDM           Richarda Overlie, MD 10/30/12 [phone]

## 2012-11-08 ENCOUNTER — Telehealth: Payer: Self-pay | Admitting: Family Medicine

## 2012-11-08 NOTE — Telephone Encounter (Signed)
Spoke with patient- follow up appt made for this Upcoming Monday to recheck infection site

## 2012-11-08 NOTE — Telephone Encounter (Signed)
Pt was seen on 10/31/12 and was prescribed antibiotic and cream for staph infection on chest and is concerned because the infection is clearing a little bit but he is down to last pill and has not cleared up yet.  He says last time he was given a stronger dose and it cleared up so he is wondering if now the dose was too low.  In any case would like a refill if dose is same.

## 2012-11-08 NOTE — Telephone Encounter (Signed)
Pt was seen on 10/31/12 and was prescribed cream and antibiotic for staph infection. He says that although the infection has cleared up a little he is now down to his last pill and infection is still persistent.  Last he had a staph infection he received a higher dosage and thinks that it is too low this time.  He is requesting a refill of the same or a stronger script.

## 2012-11-11 ENCOUNTER — Ambulatory Visit: Payer: No Typology Code available for payment source | Attending: Internal Medicine | Admitting: Internal Medicine

## 2012-11-11 VITALS — BP 118/73 | HR 69 | Temp 97.1°F | Resp 17 | Wt 177.0 lb

## 2012-11-11 DIAGNOSIS — L03319 Cellulitis of trunk, unspecified: Secondary | ICD-10-CM

## 2012-11-11 DIAGNOSIS — I10 Essential (primary) hypertension: Secondary | ICD-10-CM

## 2012-11-11 DIAGNOSIS — L738 Other specified follicular disorders: Secondary | ICD-10-CM | POA: Insufficient documentation

## 2012-11-11 DIAGNOSIS — J45909 Unspecified asthma, uncomplicated: Secondary | ICD-10-CM

## 2012-11-11 DIAGNOSIS — L02219 Cutaneous abscess of trunk, unspecified: Secondary | ICD-10-CM

## 2012-11-11 DIAGNOSIS — Z630 Problems in relationship with spouse or partner: Secondary | ICD-10-CM

## 2012-11-11 NOTE — Progress Notes (Signed)
Patient ID: Joshua Powell, male   DOB: 17-Feb-1956, 57 y.o.   MRN: [id number] Patient Demographics  Joshua Powell, is a 57 y.o. male  NFA:[id number]  ION:[id number]  DOB - 12/07/1955  Chief Complaint  Patient presents with  . Rash        Subjective:   [name] today is here for a follow up visit. He was seen earlier this month by Dr Susie Cassette for cellulitis of abd wall-and was given doxycycline, he claims the small boil/cellulitic area is significantly better. He has no fever or pain in the site as well.. Patient has No headache, No chest pain, No abdominal pain - No Nausea, No new weakness tingling or numbness, No Cough - SOB.   Objective:    Filed Vitals:   11/11/12 1349  BP: 118/73  Pulse: 69  Temp: 97.1 F (36.2 C)  Resp: 17  Weight: 177 lb (80.287 kg)  SpO2: 100%     ALLERGIES:  No Known Allergies  PAST MEDICAL HISTORY: Past Medical History  Diagnosis Date  . Asthma   . Hypertension     MEDICATIONS AT HOME: Prior to Admission medications   Medication Sig Start Date End Date Taking? Authorizing Provider  albuterol (PROVENTIL HFA;VENTOLIN HFA) 108 (90 BASE) MCG/ACT inhaler Inhale 2 puffs into the lungs every 6 (six) hours as needed for wheezing. 10/01/12  Yes Clanford Cyndie Mull, MD  aspirin 81 MG tablet Take 81 mg by mouth daily.    Historical Provider, MD  budesonide-formoterol (SYMBICORT) 160-4.5 MCG/ACT inhaler Inhale 2 puffs into the lungs 2 (two) times daily. 08/27/12   Shade Flood, MD  carbamide peroxide (GLY-OXIDE) 10 % solution Place 1 application onto teeth 3 (three) times daily as needed. 10/30/12   Richarda Overlie, MD  clonazePAM (KLONOPIN) 0.5 MG tablet Take 0.5 tablets (0.25 mg total) by mouth 2 (two) times daily as needed for anxiety. 10/29/12   Shade Flood, MD  cyanocobalamin 500 MCG tablet Take 500 mcg by mouth daily.    Historical Provider, MD  ipratropium-albuterol (DUONEB) 0.5-2.5 (3) MG/3ML SOLN Take 3 mLs by nebulization.    Historical Provider,  MD  losartan-hydrochlorothiazide (HYZAAR) 100-12.5 MG per tablet Take 1 tablet by mouth daily. 10/01/12   Clanford Cyndie Mull, MD  montelukast (SINGULAIR) 10 MG tablet Take 1 tablet (10 mg total) by mouth at bedtime. 03/01/12   Phillips Odor, MD  Multiple Vitamin (MULTIVITAMIN) tablet Take 1 tablet by mouth daily.    Historical Provider, MD  mupirocin ointment (BACTROBAN) 2 % Apply topically 3 (three) times daily. 10/30/12   Richarda Overlie, MD  tadalafil (CIALIS) 5 MG tablet Take 1 tablet (5 mg total) by mouth daily as needed. 05/30/12   Shade Flood, MD  tiotropium (SPIRIVA) 18 MCG inhalation capsule Place 1 capsule (18 mcg total) into inhaler and inhale daily. 08/27/12   Shade Flood, MD  traZODone (DESYREL) 50 MG tablet Take 1 tablet (50 mg total) by mouth at bedtime as needed for sleep. 10/01/12   Clanford Cyndie Mull, MD  triamcinolone (NASACORT) 55 MCG/ACT nasal inhaler Place 2 sprays into the nose daily.    Historical Provider, MD  vitamin B-12 (CYANOCOBALAMIN) 100 MCG tablet Take 50 mcg by mouth daily.    Historical Provider, MD  vitamin E 400 UNIT capsule Take 400 Units by mouth daily.    Historical Provider, MD     Exam  General appearance :Awake, alert, not in any distress. Speech Clear. Not toxic Looking HEENT: Atraumatic and Normocephalic,  pupils equally reactive to light and accomodation Neck: supple, no JVD. No cervical lymphadenopathy.  Chest:Good air entry bilaterally, no added sounds  CVS: S1 S2 regular, no murmurs.  Abdomen: Bowel sounds present, Non tender and not distended with no gaurding, rigidity or rebound.Small dark area (approx 1-2 cm) area at previous boil/cellulitic spot-no surrounding erythema, no swelling or any fluctuant area. Non tender as well. Extremities: B/L Lower Ext shows no edema, both legs are warm to touch Neurology: Awake alert, and oriented X 3, CN II-XII intact, Non focal Skin:No Rash Wounds:N/A    Data Review   CBC No results found for this  basename: WBC, HGB, HCT, PLT, MCV, MCH, MCHC, RDW, NEUTRABS, LYMPHSABS, MONOABS, EOSABS, BASOSABS, BANDABS, BANDSABD,  in the last 168 hours  Chemistries   No results found for this basename: NA, K, CL, CO2, GLUCOSE, BUN, CREATININE, GFRCGP, CALCIUM, MG, AST, ALT, ALKPHOS, BILITOT,  in the last 168 hours ------------------------------------------------------------------------------------------------------------------ No results found for this basename: HGBA1C,  in the last 72 hours ------------------------------------------------------------------------------------------------------------------ No results found for this basename: CHOL, HDL, LDLCALC, TRIG, CHOLHDL, LDLDIRECT,  in the last 72 hours ------------------------------------------------------------------------------------------------------------------ No results found for this basename: TSH, T4TOTAL, FREET3, T3FREE, THYROIDAB,  in the last 72 hours ------------------------------------------------------------------------------------------------------------------ No results found for this basename: VITAMINB12, FOLATE, FERRITIN, TIBC, IRON, RETICCTPCT,  in the last 72 hours  Coagulation profile  No results found for this basename: INR, PROTIME,  in the last 168 hours    Assessment & Plan   Folliculitis/Cellulitis of ant abd wall -resolved -do not think pt needs further antibiotics at this point -i have asked the patient to keep an eye at this area-and to call us prn if swelling/redness/pain develop-he claims understanding  HTN -controlled -c/w current anti-hypertensive  Asthma -stable at present  Request we check HIV status-claims he is getting married, Upon asking if he has any risky behaviour's he denies it. No hx of IVDA or freqeunt male partner's.Will check HIV antibody at his request, I have asked the patient to call the clinic to get the results.   Return visit in 2 months

## 2012-11-11 NOTE — Addendum Note (Signed)
Addended by: Lestine Mount on: 11/11/2012 02:31 PM   Modules accepted: Orders

## 2012-11-11 NOTE — Progress Notes (Signed)
Patient here for recheck on infection on torso previouslly had been diagnosed as a staph infection

## 2012-11-12 LAB — HIV ANTIBODY (ROUTINE TESTING W REFLEX): HIV: NONREACTIVE

## 2012-11-22 ENCOUNTER — Telehealth: Payer: Self-pay | Admitting: Family Medicine

## 2012-11-22 NOTE — Telephone Encounter (Signed)
Pt calling back about results for routine HIV test. Please F/U.  Thanks

## 2012-11-22 NOTE — Telephone Encounter (Signed)
11/22/12 Spoke with patient and made aware of HIV results was negative per Dr. Gean Birchwood Also patient stated that the area on his abdomen has completely healed. P.Phuong Hillary,RN

## 2012-12-10 ENCOUNTER — Telehealth: Payer: Self-pay | Admitting: Internal Medicine

## 2012-12-10 NOTE — Telephone Encounter (Signed)
error 

## 2012-12-11 ENCOUNTER — Telehealth: Payer: Self-pay | Admitting: Family Medicine

## 2012-12-11 NOTE — Telephone Encounter (Signed)
Pt would like refill for clonazePAM (KLONOPIN) 0.5 MG tablet.  Says he is an ex-smoker and has asthma and does not want to start smoking again.

## 2012-12-18 ENCOUNTER — Other Ambulatory Visit: Payer: Self-pay | Admitting: Family Medicine

## 2012-12-18 DIAGNOSIS — F411 Generalized anxiety disorder: Secondary | ICD-10-CM

## 2012-12-19 ENCOUNTER — Other Ambulatory Visit: Payer: Self-pay | Admitting: Radiology

## 2012-12-19 NOTE — Telephone Encounter (Signed)
See 10/25/12 phone message.  i will prescribe #15, but OV needed.  Is he still taking the trazodone at bedtime?, also see  Ov's with Dr. Gean Birchwood - is he a patient at other office? Thanks.

## 2012-12-19 NOTE — Telephone Encounter (Signed)
LMOM to CB. 

## 2012-12-20 NOTE — Telephone Encounter (Signed)
Pt reports that he is still taking the trazodone at night and it helps him sleep. Pt stated that he would like to come see Dr Neva Seat all the time, but he has had to go see Drs. Johnson and Ghimire sometimes because d/t his financial situation he does not always have the money to come here and there is only a $20 co-pay at the [facility] [employer] run by [facility]. He voiced understanding that he needs OV for add'l RFs of clonazepam. He is going to bring by a form for pt asst for his asthma medication that Dr Neva Seat wrote for him. He states that his asthma has been well controlled on his current meds and his BP is also under control.

## 2012-12-26 ENCOUNTER — Ambulatory Visit: Payer: No Typology Code available for payment source

## 2012-12-30 ENCOUNTER — Telehealth: Payer: Self-pay

## 2012-12-30 NOTE — Telephone Encounter (Signed)
PT STATES HE LEFT A FORM TO BE FILLED OUT FOR HIS SPIRIVA SINCE IT REQUIRE A PRE-AUTHORIZATION WANTED TO KNOW IF HE HAD BEEN DONE, HAD SPOKEN WITH BARBARA AND HE SEES DR Elisabeth Cara CALL [phone]

## 2012-12-31 NOTE — Telephone Encounter (Signed)
It is not a PA that is needed for pt's Rx. Form is for [employer] asst program. Form is in Dr Paralee Cancel box for [employer]. Pt stated that since Dr Paralee Cancel Rx is the current one on file with them that he is the provider that will need to sign for asst. [facility] for pt that form is awaiting signature and that Dr Neva Seat will not be back in office until tomorrow evening.

## 2013-01-01 NOTE — Telephone Encounter (Signed)
Form completed/signed at front desk.  Did we need to attach another Rx? Not sure based on his note on form.

## 2013-01-03 NOTE — Telephone Encounter (Signed)
Notified pt form is ready for p/up. Pt verified that no Rx is needed at this time.

## 2013-01-17 ENCOUNTER — Ambulatory Visit: Payer: No Typology Code available for payment source | Attending: Family Medicine | Admitting: Internal Medicine

## 2013-01-17 VITALS — BP 117/75 | HR 78 | Temp 98.3°F | Resp 16 | Wt 181.6 lb

## 2013-01-17 DIAGNOSIS — L02219 Cutaneous abscess of trunk, unspecified: Secondary | ICD-10-CM

## 2013-01-17 DIAGNOSIS — I10 Essential (primary) hypertension: Secondary | ICD-10-CM | POA: Insufficient documentation

## 2013-01-17 DIAGNOSIS — F17200 Nicotine dependence, unspecified, uncomplicated: Secondary | ICD-10-CM | POA: Insufficient documentation

## 2013-01-17 DIAGNOSIS — F419 Anxiety disorder, unspecified: Secondary | ICD-10-CM

## 2013-01-17 DIAGNOSIS — F411 Generalized anxiety disorder: Secondary | ICD-10-CM

## 2013-01-17 DIAGNOSIS — G4700 Insomnia, unspecified: Secondary | ICD-10-CM

## 2013-01-17 DIAGNOSIS — J45909 Unspecified asthma, uncomplicated: Secondary | ICD-10-CM | POA: Insufficient documentation

## 2013-01-17 DIAGNOSIS — L03319 Cellulitis of trunk, unspecified: Secondary | ICD-10-CM

## 2013-01-17 DIAGNOSIS — J449 Chronic obstructive pulmonary disease, unspecified: Secondary | ICD-10-CM

## 2013-01-17 MED ORDER — SULFAMETHOXAZOLE-TMP DS 800-160 MG PO TABS: 1.0000 | ORAL_TABLET | Freq: Two times a day (BID) | ORAL | Status: DC

## 2013-01-17 MED ORDER — TRAZODONE HCL 50 MG PO TABS: 50.0000 mg | ORAL_TABLET | Freq: Every evening | ORAL | Status: DC | PRN

## 2013-01-17 MED ORDER — NICOTINE 14 MG/24HR TD PT24: 1.0000 | MEDICATED_PATCH | TRANSDERMAL | Status: DC

## 2013-01-17 MED ORDER — IPRATROPIUM-ALBUTEROL 0.5-2.5 (3) MG/3ML IN SOLN: 3.0000 mL | Freq: Four times a day (QID) | RESPIRATORY_TRACT | Status: DC | PRN

## 2013-01-17 MED ORDER — ALBUTEROL SULFATE HFA 108 (90 BASE) MCG/ACT IN AERS: 2.0000 | INHALATION_SPRAY | Freq: Four times a day (QID) | RESPIRATORY_TRACT | Status: DC | PRN

## 2013-01-17 MED ORDER — MUPIROCIN 2 % EX OINT: TOPICAL_OINTMENT | Freq: Three times a day (TID) | CUTANEOUS | Status: DC

## 2013-01-17 MED ORDER — LOSARTAN POTASSIUM-HCTZ 100-12.5 MG PO TABS: 1.0000 | ORAL_TABLET | Freq: Every day | ORAL | Status: DC

## 2013-01-17 NOTE — Progress Notes (Signed)
Patient ID: Joshua Powell, male   DOB: 01/17/1956, 57 y.o.   MRN: [id number] Patient Demographics  Joshua Powell, is a 57 y.o. male  WUJ:[id number]  [id number]  DOB - 07-27-1955  Chief Complaint  Patient presents with  . abd wound        Subjective:   Joshua Powell today is here for a follow up visit. Patient has No headache, No chest pain, No abdominal pain - No Nausea, No new weakness tingling or numbness, No Cough - SOB. Patient states that his wound/cellulitis came back on his trunk, left upper quadrant 4 days ago, previously diagnosed with staph infection. This is his third episode, started as a small boil and no surrounding erythema, no active discharge   Objective:    Filed Vitals:   01/17/13 1013  BP: 117/75  Pulse: 78  Temp: 98.3 F (36.8 C)  Resp: 16  Weight: 181 lb 9.6 oz (82.373 kg)  SpO2: 100%     ALLERGIES:  No Known Allergies  PAST MEDICAL HISTORY: Past Medical History  Diagnosis Date  . Asthma   . Hypertension     MEDICATIONS AT HOME: Prior to Admission medications   Medication Sig Start Date End Date Taking? Authorizing Provider  albuterol (PROVENTIL HFA;VENTOLIN HFA) 108 (90 BASE) MCG/ACT inhaler Inhale 2 puffs into the lungs every 6 (six) hours as needed for wheezing. 01/17/13   Ripudeep Jenna Luo, MD  aspirin 81 MG tablet Take 81 mg by mouth daily.    Historical Provider, MD  budesonide-formoterol (SYMBICORT) 160-4.5 MCG/ACT inhaler Inhale 2 puffs into the lungs 2 (two) times daily. 08/27/12   Shade Flood, MD  carbamide peroxide (GLY-OXIDE) 10 % solution Place 1 application onto teeth 3 (three) times daily as needed. 10/30/12   Richarda Overlie, MD  clonazePAM (KLONOPIN) 0.5 MG tablet TAKE A HALF TABLET BY MOUTH TWICE A DAY AS NEEDED FOR ANXIETY 12/18/12   Shade Flood, MD  cyanocobalamin 500 MCG tablet Take 500 mcg by mouth daily.    Historical Provider, MD  ipratropium-albuterol (DUONEB) 0.5-2.5 (3) MG/3ML SOLN Take 3 mLs by nebulization every 6  (six) hours as needed (shortness of breath). 01/17/13   Ripudeep Jenna Luo, MD  losartan-hydrochlorothiazide (HYZAAR) 100-12.5 MG per tablet Take 1 tablet by mouth daily. 01/17/13   Ripudeep Jenna Luo, MD  Multiple Vitamin (MULTIVITAMIN) tablet Take 1 tablet by mouth daily.    Historical Provider, MD  mupirocin ointment (BACTROBAN) 2 % Apply topically 3 (three) times daily. 01/17/13   Ripudeep Jenna Luo, MD  nicotine (NICODERM CQ) 14 mg/24hr patch Place 1 patch onto the skin daily. 01/17/13   Ripudeep Jenna Luo, MD  sulfamethoxazole-trimethoprim (BACTRIM DS) 800-160 MG per tablet Take 1 tablet by mouth 2 (two) times daily. X 2 weeks 01/17/13   Ripudeep Jenna Luo, MD  tadalafil (CIALIS) 5 MG tablet Take 1 tablet (5 mg total) by mouth daily as needed. 05/30/12   Shade Flood, MD  tiotropium (SPIRIVA) 18 MCG inhalation capsule Place 1 capsule (18 mcg total) into inhaler and inhale daily. 08/27/12   Shade Flood, MD  traZODone (DESYREL) 50 MG tablet Take 1 tablet (50 mg total) by mouth at bedtime as needed for sleep. 01/17/13   Ripudeep Jenna Luo, MD  triamcinolone (NASACORT) 55 MCG/ACT nasal inhaler Place 2 sprays into the nose daily.    Historical Provider, MD  vitamin B-12 (CYANOCOBALAMIN) 100 MCG tablet Take 50 mcg by mouth daily.    Historical Provider, MD  vitamin E  400 UNIT capsule Take 400 Units by mouth daily.    Historical Provider, MD     Exam  General appearance :Awake, alert, NAD, Speech Clear.  HEENT: Atraumatic and Normocephalic, PERLA Neck: supple, no JVD. No cervical lymphadenopathy.  Chest: Clear to auscultation bilaterally, no wheezing, rales or rhonchi CVS: S1 S2 regular, no murmurs.  Abdomen: soft, NBS, NT, ND, no gaurding, rigidity or rebound. Extremities: no cyanosis or clubbing, B/L Lower Ext shows no edema Neurology: Awake alert, and oriented X 3, CN II-XII intact, Non focal Skin: about 3x4cm area of cellulitis with slight induration, no active discharge on LUQ trunk Wounds:N/A    Data  Review   Basic Metabolic Panel: No results found for this basename: NA, K, CL, CO2, GLUCOSE, BUN, CREATININE, CALCIUM, MG, PHOS,  in the last 168 hours Liver Function Tests: No results found for this basename: AST, ALT, ALKPHOS, BILITOT, PROT, ALBUMIN,  in the last 168 hours  CBC: No results found for this basename: WBC, NEUTROABS, HGB, HCT, MCV, PLT,  in the last 168 hours  ------------------------------------------------------------------------------------------------------------------ No results found for this basename: HGBA1C,  in the last 72 hours ------------------------------------------------------------------------------------------------------------------ No results found for this basename: CHOL, HDL, LDLCALC, TRIG, CHOLHDL, LDLDIRECT,  in the last 72 hours ------------------------------------------------------------------------------------------------------------------ No results found for this basename: TSH, T4TOTAL, FREET3, T3FREE, THYROIDAB,  in the last 72 hours ------------------------------------------------------------------------------------------------------------------ No results found for this basename: VITAMINB12, FOLATE, FERRITIN, TIBC, IRON, RETICCTPCT,  in the last 72 hours  Coagulation profile  No results found for this basename: INR, PROTIME,  in the last 168 hours    Assessment & Plan   Active Problems: Folliculitis/Cellulitis of ant abd wall : Likely recurrent staph infection  -Will place on Bactrim DS 1 tab BID x 2weeks, mupirocin ointment - HIV neg - Ambulatory referral to infectious disease clinic, will followup on the cellulitis in 2 weeks  HTN  -controlled, refill antihypertensives   Asthma  -stable at present, refilledDuo-nebs, albuterol inhaler,    Nicotine dependence: - Patient is actively trying to quit smoking, has started electronic cigarettes however states he's very anxious and craving. Place on nicotine patch.   anxiety : -  Patient requesting for Xanax. - Placed ambulatory referral to psychiatry. Explained to the patient that he needs to be evaluated by psychiatry and they can prescribe him Xanax if he needs it.   Follow-up in 2 weeks.       RAI,RIPUDEEP M.D. 01/17/2013, 10:50 AM

## 2013-01-17 NOTE — Progress Notes (Signed)
Patient has an abcess to left side of Abd Prone to staff infections

## 2013-01-31 ENCOUNTER — Ambulatory Visit: Payer: No Typology Code available for payment source

## 2013-02-05 ENCOUNTER — Encounter: Payer: Self-pay | Admitting: Internal Medicine

## 2013-02-05 ENCOUNTER — Ambulatory Visit (INDEPENDENT_AMBULATORY_CARE_PROVIDER_SITE_OTHER): Payer: No Typology Code available for payment source | Admitting: Internal Medicine

## 2013-02-05 VITALS — BP 138/80 | HR 76 | Temp 98.1°F | Ht 68.5 in | Wt 181.0 lb

## 2013-02-05 DIAGNOSIS — L02219 Cutaneous abscess of trunk, unspecified: Secondary | ICD-10-CM

## 2013-02-05 DIAGNOSIS — L03319 Cellulitis of trunk, unspecified: Secondary | ICD-10-CM

## 2013-02-05 MED ORDER — CHLORHEXIDINE GLUCONATE 2 % EX LIQD: 20.0000 mL | CUTANEOUS | Status: DC

## 2013-02-05 NOTE — Progress Notes (Signed)
Patient ID: Joshua Powell, male   DOB: 1955/09/17, 57 y.o.   MRN: [id number]         [facility]  Reason for Consult: Recurrent abdominal wall cellulitis Referring Physician: Dr. Jeoffrey Massed  Patient Active Problem List   Diagnosis Date Noted  . Cellulitis of trunk 01/17/2013    Priority: High  . Nicotine dependence 01/17/2013  . COPD (chronic obstructive pulmonary disease) 10/01/2012  . Asthma 10/01/2012  . Hypertension 10/01/2012  . Anxiety disorder 10/01/2012  . Insomnia 10/01/2012    Patient's Medications  New Prescriptions   CHLORHEXIDINE GLUCONATE 2 % LIQD    Apply 20 mL topically every Wednesday.  Previous Medications   ALBUTEROL (PROVENTIL HFA;VENTOLIN HFA) 108 (90 BASE) MCG/ACT INHALER    Inhale 2 puffs into the lungs every 6 (six) hours as needed for wheezing.   ASPIRIN 81 MG TABLET    Take 81 mg by mouth daily.   BUDESONIDE-FORMOTEROL (SYMBICORT) 160-4.5 MCG/ACT INHALER    Inhale 2 puffs into the lungs 2 (two) times daily.   CALCIUM CARBONATE (OS-CAL - DOSED IN MG OF ELEMENTAL CALCIUM) 1250 MG TABLET    Take 1 tablet by mouth daily with breakfast.   CARBAMIDE PEROXIDE (GLY-OXIDE) 10 % SOLUTION    Place 1 application onto teeth 3 (three) times daily as needed.   CLONAZEPAM (KLONOPIN) 0.5 MG TABLET    TAKE A HALF TABLET BY MOUTH TWICE A DAY AS NEEDED FOR ANXIETY   CYANOCOBALAMIN 500 MCG TABLET    Take 500 mcg by mouth daily.   FISH OIL-OMEGA-3 FATTY ACIDS 1000 MG CAPSULE    Take 1 g by mouth daily.   IPRATROPIUM-ALBUTEROL (DUONEB) 0.5-2.5 (3) MG/3ML SOLN    Take 3 mLs by nebulization every 6 (six) hours as needed (shortness of breath).   LORATADINE (CLARITIN) 10 MG TABLET    Take 10 mg by mouth daily.   LOSARTAN-HYDROCHLOROTHIAZIDE (HYZAAR) 100-12.5 MG PER TABLET    Take 1 tablet by mouth daily.   MAGNESIUM 250 MG TABS    Take 1 tablet by mouth at bedtime.   MULTIPLE VITAMIN (MULTIVITAMIN) TABLET    Take 1 tablet by mouth daily.   MUPIROCIN OINTMENT (BACTROBAN) 2 %    Apply topically 3 (three) times daily.   NICOTINE (NICODERM CQ) 14 MG/24HR PATCH    Place 1 patch onto the skin daily.   TADALAFIL (CIALIS) 5 MG TABLET    Take 1 tablet (5 mg total) by mouth daily as needed.   TIOTROPIUM (SPIRIVA) 18 MCG INHALATION CAPSULE    Place 1 capsule (18 mcg total) into inhaler and inhale daily.   TRAZODONE (DESYREL) 50 MG TABLET    Take 1 tablet (50 mg total) by mouth at bedtime as needed for sleep.   TRIAMCINOLONE (NASACORT) 55 MCG/ACT NASAL INHALER    Place 2 sprays into the nose daily.   VITAMIN B-12 (CYANOCOBALAMIN) 100 MCG TABLET    Take 50 mcg by mouth daily.   VITAMIN E 400 UNIT CAPSULE    Take 400 Units by mouth daily.  Modified Medications   No medications on file  Discontinued Medications   SULFAMETHOXAZOLE-TRIMETHOPRIM (BACTRIM DS) 800-160 MG PER TABLET    Take 1 tablet by mouth 2 (two) times daily. X 2 weeks    Recommendations: 1. Mupirocin intranasal ointment twice daily for 5 days 2. Chlorhexidine bathing once weekly for 4 weeks 3. Avoid shaving of chest and abdominal wall 4. Followup here as needed   Assessment:  He's had 3 episodes of cellulitis in the same position on his abdominal wall. The first 2 were precipitated by shaving. He only culture that appears he has had was negative but I suspect that he may have community-acquired MRSA. I have informed him of the uncertainty around how to prevent these lesions but suggested that we use chlorhexidine bathing once weekly for one month and intranasal mupirocin cream in addition to avoiding shaving in the future.  HPI: Joshua Powell is a 57 y.o. male who developed an infection on his abdominal wall in June of last year. This occurred shortly after he shaved his chest and abdominal wall. He was seen at our urgent care Center. Cultures of the lesion did not show any organisms on Gram stain or culture. He was treated with topical and oral antibiotics (I believe doxycycline)  and improved. He had a second occurrence in the same location in his left upper quadrant about 6 months later. I cannot locate any cultures. He was treated the same way and improved promptly. He has not shaved his abdominal wall in about 4 months but had his third occurrence recently. He was treated with topical mupirocin and oral trimethoprim sulfamethoxazole. He completed 2 weeks of therapy a few days ago and is much improved. He has not had any other skin lesions. He is not aware of having been around anybody else with similar lesions or MRSA.  Review of Systems: Constitutional: negative for anorexia, chills, fevers, malaise, sweats and weight loss Eyes: negative Ears, nose, mouth, throat, and face: positive for allergic rhinitis, negative for nasal congestion, sore mouth and sore throat Respiratory: positive for asthma, negative for cough, dyspnea on exertion and sputum Cardiovascular: negative Gastrointestinal: negative Genitourinary:negative    Past Medical History  Diagnosis Date  . Asthma   . Hypertension     History  Substance Use Topics  . Smoking status: Former Smoker    Types: Cigarettes    Quit date: 11/30/2010  . Smokeless tobacco: Not on file     Comment: uses electronic cigarettes PRN  . Alcohol Use: No    Family History  Problem Relation Age of Onset  . Hypertension Other   . Heart disease Father    No Known Allergies  OBJECTIVE: Blood pressure 138/80, pulse 76, temperature 98.1 F (36.7 C), temperature source Oral, height 5' 8.5" (1.74 m), weight 181 lb (82.101 kg). General: He is alert and in no distress Skin: He has a dry healing 10 cm lesion in his left upper quadrant on the abdominal wall. There is no fluctuance or drainage and it is nontender. He has some surrounding hyperpigmentation that appears to have been a tape reaction from recent bandages.  Lungs: Clear  Cor: Regular S1 and S2 with no murmurs Abdomen: Nontender   Microbiology: No results  found for this or any previous visit (from the past 240 hour(s)).  Cliffton Asters, MD [facility] [phone] pager   [phone] cell 02/05/2013, 4:47 PM

## 2013-03-07 ENCOUNTER — Encounter: Payer: Self-pay | Admitting: Radiology

## 2013-03-07 DIAGNOSIS — N529 Male erectile dysfunction, unspecified: Secondary | ICD-10-CM | POA: Insufficient documentation

## 2013-05-17 ENCOUNTER — Other Ambulatory Visit: Payer: Self-pay | Admitting: Physician Assistant

## 2013-06-03 ENCOUNTER — Other Ambulatory Visit: Payer: Self-pay | Admitting: Family Medicine

## 2013-06-30 ENCOUNTER — Other Ambulatory Visit: Payer: Self-pay | Admitting: Physician Assistant

## 2013-07-27 ENCOUNTER — Other Ambulatory Visit: Payer: Self-pay | Admitting: Family Medicine

## 2013-08-21 ENCOUNTER — Ambulatory Visit: Payer: Self-pay

## 2013-09-04 ENCOUNTER — Other Ambulatory Visit: Payer: Self-pay | Admitting: *Deleted

## 2013-09-04 DIAGNOSIS — Z72820 Sleep deprivation: Secondary | ICD-10-CM

## 2013-09-04 DIAGNOSIS — I10 Essential (primary) hypertension: Secondary | ICD-10-CM

## 2013-09-04 MED ORDER — TRAZODONE HCL 50 MG PO TABS: 50.0000 mg | ORAL_TABLET | Freq: Every evening | ORAL | Status: DC | PRN

## 2013-09-04 MED ORDER — LOSARTAN POTASSIUM-HCTZ 100-12.5 MG PO TABS: 1.0000 | ORAL_TABLET | Freq: Every day | ORAL | Status: DC

## 2013-09-12 ENCOUNTER — Ambulatory Visit: Payer: No Typology Code available for payment source | Attending: Internal Medicine | Admitting: Internal Medicine

## 2013-09-12 VITALS — BP 125/87 | HR 82 | Temp 98.8°F | Ht 69.0 in | Wt 190.8 lb

## 2013-09-12 DIAGNOSIS — Z87891 Personal history of nicotine dependence: Secondary | ICD-10-CM | POA: Insufficient documentation

## 2013-09-12 DIAGNOSIS — J4489 Other specified chronic obstructive pulmonary disease: Secondary | ICD-10-CM | POA: Insufficient documentation

## 2013-09-12 DIAGNOSIS — I10 Essential (primary) hypertension: Secondary | ICD-10-CM | POA: Insufficient documentation

## 2013-09-12 DIAGNOSIS — J45909 Unspecified asthma, uncomplicated: Secondary | ICD-10-CM

## 2013-09-12 DIAGNOSIS — J449 Chronic obstructive pulmonary disease, unspecified: Secondary | ICD-10-CM | POA: Insufficient documentation

## 2013-09-12 DIAGNOSIS — Z7982 Long term (current) use of aspirin: Secondary | ICD-10-CM | POA: Insufficient documentation

## 2013-09-12 DIAGNOSIS — Z79899 Other long term (current) drug therapy: Secondary | ICD-10-CM | POA: Insufficient documentation

## 2013-09-12 DIAGNOSIS — G4700 Insomnia, unspecified: Secondary | ICD-10-CM | POA: Insufficient documentation

## 2013-09-12 DIAGNOSIS — Z760 Encounter for issue of repeat prescription: Secondary | ICD-10-CM | POA: Insufficient documentation

## 2013-09-12 DIAGNOSIS — F411 Generalized anxiety disorder: Secondary | ICD-10-CM | POA: Insufficient documentation

## 2013-09-12 LAB — CBC WITH DIFFERENTIAL/PLATELET
Basophils Absolute: 0.1 10*3/uL (ref 0.0–0.1)
Basophils Relative: 1 % (ref 0–1)
EOS ABS: 0.3 10*3/uL (ref 0.0–0.7)
Eosinophils Relative: 5 % (ref 0–5)
HCT: 40.5 % (ref 39.0–52.0)
HEMOGLOBIN: 14.5 g/dL (ref 13.0–17.0)
LYMPHS ABS: 2.2 10*3/uL (ref 0.7–4.0)
LYMPHS PCT: 41 % (ref 12–46)
MCH: 34.2 pg — AB (ref 26.0–34.0)
MCHC: 35.8 g/dL (ref 30.0–36.0)
MCV: 95.5 fL (ref 78.0–100.0)
MONOS PCT: 6 % (ref 3–12)
Monocytes Absolute: 0.3 10*3/uL (ref 0.1–1.0)
NEUTROS PCT: 47 % (ref 43–77)
Neutro Abs: 2.5 10*3/uL (ref 1.7–7.7)
Platelets: 245 10*3/uL (ref 150–400)
RBC: 4.24 MIL/uL (ref 4.22–5.81)
RDW: 12.6 % (ref 11.5–15.5)
WBC: 5.4 10*3/uL (ref 4.0–10.5)

## 2013-09-12 LAB — COMPLETE METABOLIC PANEL WITH GFR
ALBUMIN: 4.4 g/dL (ref 3.5–5.2)
ALT: 19 U/L (ref 0–53)
AST: 21 U/L (ref 0–37)
Alkaline Phosphatase: 57 U/L (ref 39–117)
BILIRUBIN TOTAL: 0.6 mg/dL (ref 0.2–1.2)
BUN: 10 mg/dL (ref 6–23)
CALCIUM: 9.4 mg/dL (ref 8.4–10.5)
CHLORIDE: 98 meq/L (ref 96–112)
CO2: 31 meq/L (ref 19–32)
Creat: 1.26 mg/dL (ref 0.50–1.35)
GFR, EST AFRICAN AMERICAN: 73 mL/min
GFR, EST NON AFRICAN AMERICAN: 63 mL/min
GLUCOSE: 98 mg/dL (ref 70–99)
POTASSIUM: 3.8 meq/L (ref 3.5–5.3)
SODIUM: 140 meq/L (ref 135–145)
TOTAL PROTEIN: 6.9 g/dL (ref 6.0–8.3)

## 2013-09-12 LAB — LIPID PANEL
Cholesterol: 204 mg/dL — ABNORMAL HIGH (ref 0–200)
HDL: 41 mg/dL (ref >39)
LDL CALC: 142 mg/dL — AB (ref 0–99)
Total CHOL/HDL Ratio: 5 Ratio
Triglycerides: 106 mg/dL (ref <150)
VLDL: 21 mg/dL (ref 0–40)

## 2013-09-12 LAB — TSH: TSH: 0.824 u[IU]/mL (ref 0.350–4.500)

## 2013-09-12 MED ORDER — TIOTROPIUM BROMIDE MONOHYDRATE 18 MCG IN CAPS: 18.0000 ug | ORAL_CAPSULE | Freq: Every day | RESPIRATORY_TRACT | Status: DC

## 2013-09-12 MED ORDER — ALBUTEROL SULFATE HFA 108 (90 BASE) MCG/ACT IN AERS: 2.0000 | INHALATION_SPRAY | Freq: Four times a day (QID) | RESPIRATORY_TRACT | Status: DC | PRN

## 2013-09-12 MED ORDER — BUDESONIDE-FORMOTEROL FUMARATE 160-4.5 MCG/ACT IN AERO: 2.0000 | INHALATION_SPRAY | Freq: Two times a day (BID) | RESPIRATORY_TRACT | Status: DC

## 2013-09-12 MED ORDER — LOSARTAN POTASSIUM-HCTZ 100-12.5 MG PO TABS: 1.0000 | ORAL_TABLET | Freq: Every day | ORAL | Status: DC

## 2013-09-12 NOTE — Progress Notes (Signed)
Patient is here today for HTN follow up and medication refills

## 2013-09-12 NOTE — Progress Notes (Signed)
Patient ID: Joshua CooleyBryan Rotunno, male   DOB: 12/28/1955, 58 y.o.   MRN: [id number]   CC:  HPI: 58 year old male with history of hypertension, asthma here for a followup. Requesting refill for the record, albuterol, Spiriva. Denies any cough chest pain or shortness of breath. Blood pressure is well controlled. The patient has started exercising and running and feels excellent. Requesting a colonoscopy Patient also concerned about his prostate but does not report any urinary symptoms    No Known Allergies Past Medical History  Diagnosis Date  . Asthma   . Hypertension    Current Outpatient Prescriptions on File Prior to Visit  Medication Sig Dispense Refill  . aspirin 81 MG tablet Take 81 mg by mouth daily.      . calcium carbonate (OS-CAL - DOSED IN MG OF ELEMENTAL CALCIUM) 1250 MG tablet Take 1 tablet by mouth daily with breakfast.      . clonazePAM (KLONOPIN) 0.5 MG tablet TAKE A HALF TABLET BY MOUTH TWICE A DAY AS NEEDED FOR ANXIETY  15 tablet  0  . cyanocobalamin 500 MCG tablet Take 500 mcg by mouth daily.      . fish oil-omega-3 fatty acids 1000 MG capsule Take 1 g by mouth daily.      Marland Kitchen. ipratropium-albuterol (DUONEB) 0.5-2.5 (3) MG/3ML SOLN Take 3 mLs by nebulization every 6 (six) hours as needed (shortness of breath).  360 mL  3  . loratadine (CLARITIN) 10 MG tablet Take 10 mg by mouth daily.      Marland Kitchen. losartan (COZAAR) 50 MG tablet TAKE 1 TABLET BY MOUTH EVERY DAY  30 tablet  0  . Magnesium 250 MG TABS Take 1 tablet by mouth at bedtime.      . mupirocin ointment (BACTROBAN) 2 % Apply topically 3 (three) times daily.  22 g  3  . nicotine (NICODERM CQ) 14 mg/24hr patch Place 1 patch onto the skin daily.  28 patch  1  . tadalafil (CIALIS) 5 MG tablet Take 1 tablet (5 mg total) by mouth daily as needed.  120 tablet  0  . traZODone (DESYREL) 50 MG tablet Take 1 tablet (50 mg total) by mouth at bedtime as needed for sleep.  30 tablet  3  . triamcinolone (NASACORT) 55 MCG/ACT nasal inhaler Place  2 sprays into the nose daily.      . vitamin B-12 (CYANOCOBALAMIN) 100 MCG tablet Take 50 mcg by mouth daily.      . vitamin E 400 UNIT capsule Take 400 Units by mouth daily.      . Multiple Vitamin (MULTIVITAMIN) tablet Take 1 tablet by mouth daily.       No current facility-administered medications on file prior to visit.   Family History  Problem Relation Age of Onset  . Hypertension Other   . Heart disease Father    History   Social History  . Marital Status: Divorced    Spouse Name: N/A    Number of Children: N/A  . Years of Education: N/A   Occupational History  . Not on file.   Social History Main Topics  . Smoking status: Former Smoker    Types: Cigarettes    Quit date: 11/30/2010  . Smokeless tobacco: Not on file     Comment: uses electronic cigarettes PRN  . Alcohol Use: No  . Drug Use: No  . Sexual Activity: Yes    Birth Control/ Protection: Abstinence   Other Topics Concern  . Not on file   Social  History Narrative  . No narrative on file    Review of Systems  Constitutional: Negative for fever, chills, diaphoresis, activity change, appetite change and fatigue.  HENT: Negative for ear pain, nosebleeds, congestion, facial swelling, rhinorrhea, neck pain, neck stiffness and ear discharge.   Eyes: Negative for pain, discharge, redness, itching and visual disturbance.  Respiratory: Negative for cough, choking, chest tightness, shortness of breath, wheezing and stridor.   Cardiovascular: Negative for chest pain, palpitations and leg swelling.  Gastrointestinal: Negative for abdominal distention.  Genitourinary: Negative for dysuria, urgency, frequency, hematuria, flank pain, decreased urine volume, difficulty urinating and dyspareunia.  Musculoskeletal: Negative for back pain, joint swelling, arthralgias and gait problem.  Neurological: Negative for dizziness, tremors, seizures, syncope, facial asymmetry, speech difficulty, weakness, light-headedness, numbness  and headaches.  Hematological: Negative for adenopathy. Does not bruise/bleed easily.  Psychiatric/Behavioral: Negative for hallucinations, behavioral problems, confusion, dysphoric mood, decreased concentration and agitation.    Objective:   Filed Vitals:   09/12/13 1636  BP: 125/87  Pulse: 82  Temp: 98.8 F (37.1 C)    Physical Exam  Constitutional: Appears well-developed and well-nourished. No distress.  HENT: Normocephalic. External right and left ear normal. Oropharynx is clear and moist.  Eyes: Conjunctivae and EOM are normal. PERRLA, no scleral icterus.  Neck: Normal ROM. Neck supple. No JVD. No tracheal deviation. No thyromegaly.  CVS: RRR, S1/S2 +, no murmurs, no gallops, no carotid bruit.  Pulmonary: Effort and breath sounds normal, no stridor, rhonchi, wheezes, rales.  Abdominal: Soft. BS +,  no distension, tenderness, rebound or guarding.  Musculoskeletal: Normal range of motion. No edema and no tenderness.  Lymphadenopathy: No lymphadenopathy noted, cervical, inguinal. Neuro: Alert. Normal reflexes, muscle tone coordination. No cranial nerve deficit. Skin: Skin is warm and dry. No rash noted. Not diaphoretic. No erythema. No pallor.  Psychiatric: Normal mood and affect. Behavior, judgment, thought content normal.   Lab Results  Component Value Date   WBC 4.0 10/01/2012   HGB 15.0 10/01/2012   HCT 42.0 10/01/2012   MCV 94.4 10/01/2012   PLT 222 10/01/2012   Lab Results  Component Value Date   CREATININE 1.15 10/01/2012   BUN 7 10/01/2012   NA 140 10/01/2012   K 4.2 10/01/2012   CL 101 10/01/2012   CO2 29 10/01/2012    Lab Results  Component Value Date   HGBA1C 4.8 10/01/2012   Lipid Panel     Component Value Date/Time   CHOL 204* 10/01/2012 1600   TRIG 67 10/01/2012 1600   HDL 58 10/01/2012 1600   CHOLHDL 3.5 10/01/2012 1600   VLDL 13 10/01/2012 1600   LDLCALC 133* 10/01/2012 1600       Assessment and plan:   Patient Active Problem List   Diagnosis Date Noted  . Erectile  dysfunction 03/07/2013  . Cellulitis of trunk 01/17/2013  . Nicotine dependence 01/17/2013  . COPD (chronic obstructive pulmonary disease) 10/01/2012  . Asthma 10/01/2012  . Hypertension 10/01/2012  . Anxiety disorder 10/01/2012  . Insomnia 10/01/2012   Hypertension Refilled lisinopril/HCTZ    COPD    Continue Spiriva and Pulmicort and albuterol Patient has already quit smoking  Routine blood work today Patient to followup in 3-4 months GI referral for routine colonoscopy   The patient was given clear instructions to go to ER or return to medical center if symptoms don't improve, worsen or new problems develop. The patient verbalized understanding. The patient was told to call to get any lab results if not  heard anything in the next week.

## 2013-09-13 LAB — PSA: PSA: 0.62 ng/mL (ref <=4.00)

## 2013-09-15 ENCOUNTER — Encounter: Payer: Self-pay | Admitting: Internal Medicine

## 2013-09-15 NOTE — Addendum Note (Signed)
Addended by: Susie CassetteABROL MD, Germain OsgoodNAYANA on: 09/15/2013 11:22 AM   Modules accepted: Orders

## 2013-09-17 ENCOUNTER — Other Ambulatory Visit: Payer: Self-pay | Admitting: Internal Medicine

## 2013-09-17 DIAGNOSIS — J45909 Unspecified asthma, uncomplicated: Secondary | ICD-10-CM

## 2013-09-17 MED ORDER — BUDESONIDE-FORMOTEROL FUMARATE 160-4.5 MCG/ACT IN AERO: 2.0000 | INHALATION_SPRAY | Freq: Two times a day (BID) | RESPIRATORY_TRACT | Status: DC

## 2013-09-17 MED ORDER — TIOTROPIUM BROMIDE MONOHYDRATE 18 MCG IN CAPS: 18.0000 ug | ORAL_CAPSULE | Freq: Every day | RESPIRATORY_TRACT | Status: DC

## 2013-09-17 MED ORDER — ALBUTEROL SULFATE HFA 108 (90 BASE) MCG/ACT IN AERS: 2.0000 | INHALATION_SPRAY | Freq: Four times a day (QID) | RESPIRATORY_TRACT | Status: DC | PRN

## 2013-10-14 ENCOUNTER — Telehealth: Payer: Self-pay | Admitting: Internal Medicine

## 2013-10-14 NOTE — Telephone Encounter (Signed)
Pt called regarding his medication traZODone (DESYREL) 50 MG tablet, pt states that this medication is not working and would like for the Doctor to prescribe him something a little strong so he could sleep. Please contact pt

## 2013-10-15 NOTE — Telephone Encounter (Signed)
Pt is requesting stronger medication than trazadone. please f/u

## 2013-10-17 ENCOUNTER — Telehealth: Payer: Self-pay | Admitting: *Deleted

## 2013-10-17 DIAGNOSIS — Z72820 Sleep deprivation: Secondary | ICD-10-CM

## 2013-10-17 NOTE — Telephone Encounter (Signed)
Pt is aware that he is at the maximum dose of topamax. Today the Dr. referred him to a physiatrist for further evaluation.

## 2013-10-17 NOTE — Telephone Encounter (Signed)
Pt calling back regarding med problem. Please f/u with pt.

## 2013-10-17 NOTE — Telephone Encounter (Signed)
Spoke to pt and I told him that I was waiting on the Dr. To respond to my message. When I get the message I will call the pt back asap.

## 2013-10-22 ENCOUNTER — Ambulatory Visit (AMBULATORY_SURGERY_CENTER): Payer: No Typology Code available for payment source | Admitting: *Deleted

## 2013-10-22 VITALS — Ht 68.75 in | Wt 190.4 lb

## 2013-10-22 DIAGNOSIS — Z1211 Encounter for screening for malignant neoplasm of colon: Secondary | ICD-10-CM

## 2013-10-22 MED ORDER — MOVIPREP 100 G PO SOLR: ORAL | Status: DC

## 2013-10-22 NOTE — Progress Notes (Signed)
Patient denies any allergies to eggs or soy. No oxygen use at home, no diet/wt loss pills per patient. EMMI education given on colonoscopy.

## 2013-11-05 ENCOUNTER — Ambulatory Visit: Payer: Self-pay | Admitting: Internal Medicine

## 2013-11-05 DIAGNOSIS — Z1211 Encounter for screening for malignant neoplasm of colon: Secondary | ICD-10-CM

## 2013-11-05 NOTE — Progress Notes (Signed)
Pt arrived for procedure and states that he is accompanied by his elderly father who he doesn't believe will be able to wait on him during his procedure.  I explained to him that he needed someone to wait here in the facility or we would not be able to do his procedure.  I offered the option of his father waiting in the consultation room in a recliner where he may be more comfortable but he states the he is "not very patient".  He wishes to reschedule to another day and will bring someone else or as I suggested, have the 1st of the morning or afternoon appointment.

## 2013-12-08 ENCOUNTER — Ambulatory Visit: Payer: No Typology Code available for payment source | Admitting: Internal Medicine

## 2013-12-29 ENCOUNTER — Other Ambulatory Visit: Payer: Self-pay | Admitting: Internal Medicine

## 2013-12-29 DIAGNOSIS — Z72820 Sleep deprivation: Secondary | ICD-10-CM

## 2013-12-29 DIAGNOSIS — I10 Essential (primary) hypertension: Secondary | ICD-10-CM

## 2013-12-29 NOTE — Telephone Encounter (Signed)
Pt. Has an appointment scheduled for 7/22 but would like a refill on traZODone (DESYREL) 50 MG tablet [[id number]][[id number]], losartan-hydrochlorothiazide (HYZAAR) 100-12.5 MG per tablet [[id number]][[id number]] , he no longer has refills. Please call patient if refills can be ordered.

## 2014-01-02 MED ORDER — LOSARTAN POTASSIUM-HCTZ 100-12.5 MG PO TABS: 1.0000 | ORAL_TABLET | Freq: Every day | ORAL | Status: DC

## 2014-01-02 MED ORDER — TRAZODONE HCL 50 MG PO TABS: 50.0000 mg | ORAL_TABLET | Freq: Every evening | ORAL | Status: DC | PRN

## 2014-01-14 ENCOUNTER — Ambulatory Visit: Payer: No Typology Code available for payment source | Admitting: Internal Medicine

## 2014-01-26 ENCOUNTER — Encounter: Payer: Self-pay | Admitting: Internal Medicine

## 2014-01-26 ENCOUNTER — Ambulatory Visit: Payer: No Typology Code available for payment source | Attending: Internal Medicine | Admitting: Internal Medicine

## 2014-01-26 VITALS — BP 129/86 | HR 99 | Temp 98.8°F | Resp 16 | Wt 186.0 lb

## 2014-01-26 DIAGNOSIS — M25531 Pain in right wrist: Secondary | ICD-10-CM

## 2014-01-26 DIAGNOSIS — J449 Chronic obstructive pulmonary disease, unspecified: Secondary | ICD-10-CM | POA: Insufficient documentation

## 2014-01-26 DIAGNOSIS — J4489 Other specified chronic obstructive pulmonary disease: Secondary | ICD-10-CM | POA: Insufficient documentation

## 2014-01-26 DIAGNOSIS — G4700 Insomnia, unspecified: Secondary | ICD-10-CM

## 2014-01-26 DIAGNOSIS — Z87891 Personal history of nicotine dependence: Secondary | ICD-10-CM | POA: Insufficient documentation

## 2014-01-26 DIAGNOSIS — M25539 Pain in unspecified wrist: Secondary | ICD-10-CM

## 2014-01-26 DIAGNOSIS — Z7982 Long term (current) use of aspirin: Secondary | ICD-10-CM | POA: Insufficient documentation

## 2014-01-26 DIAGNOSIS — I10 Essential (primary) hypertension: Secondary | ICD-10-CM

## 2014-01-26 NOTE — Progress Notes (Signed)
MRN: [id number] Name: Joshua Powell  Sex: male Age: 58 y.o. DOB: 1955/12/28  Allergies: Review of patient's allergies indicates no known allergies.  Chief Complaint  Patient presents with  . Wrist Pain    HPI: Patient is 58 y.o. male who has to of hypertension comes today for followup, has been compliant in taking his medications, is taking Hyzaar he splits the medication to have and take 2 times a day, denies any headache dizziness chest and shortness of breath complaining of some right wrist pain denies any fall or trauma denies any numbness weakness, he also reported to have history of insomnia and is taking trazodone.  Past Medical History  Diagnosis Date  . Asthma   . Hypertension   . COPD (chronic obstructive pulmonary disease)   . Allergy     Past Surgical History  Procedure Laterality Date  . No past surgeries        Medication List       This list is accurate as of: 01/26/14  3:26 PM.  Always use your most recent med list.               albuterol 108 (90 BASE) MCG/ACT inhaler  Commonly known as:  PROVENTIL HFA;VENTOLIN HFA  Inhale 2 puffs into the lungs every 6 (six) hours as needed for wheezing.     aspirin 81 MG tablet  Take 81 mg by mouth daily.     budesonide-formoterol 160-4.5 MCG/ACT inhaler  Commonly known as:  SYMBICORT  Inhale 2 puffs into the lungs 2 (two) times daily.     calcium carbonate 1250 MG tablet  Commonly known as:  OS-CAL - dosed in mg of elemental calcium  Take 1 tablet by mouth daily with breakfast.     cyanocobalamin 500 MCG tablet  Take 500 mcg by mouth daily.     vitamin B-12 100 MCG tablet  Commonly known as:  CYANOCOBALAMIN  Take 50 mcg by mouth daily.     fish oil-omega-3 fatty acids 1000 MG capsule  Take 1 g by mouth daily.     ipratropium-albuterol 0.5-2.5 (3) MG/3ML Soln  Commonly known as:  DUONEB  Take 3 mLs by nebulization every 6 (six) hours as needed (shortness of breath).     loratadine 10 MG tablet    Commonly known as:  CLARITIN  Take 10 mg by mouth daily.     losartan-hydrochlorothiazide 100-12.5 MG per tablet  Commonly known as:  HYZAAR  Take 1 tablet by mouth daily.     Melatonin 10 MG Tabs  Take 1 tablet by mouth at bedtime.     MOVIPREP 100 G Solr  Generic drug:  peg 3350 powder  Take as directed     multivitamin tablet  Take 1 tablet by mouth daily.     tiotropium 18 MCG inhalation capsule  Commonly known as:  SPIRIVA  Place 1 capsule (18 mcg total) into inhaler and inhale daily.     traZODone 50 MG tablet  Commonly known as:  DESYREL  Take 1 tablet (50 mg total) by mouth at bedtime as needed for sleep.     vitamin E 400 UNIT capsule  Take 400 Units by mouth daily.        No orders of the defined types were placed in this encounter.     There is no immunization history on file for this patient.  Family History  Problem Relation Age of Onset  . Hypertension Other   . Heart  disease Father   . Colon polyps Father [id number]    "tumor removed" had colostomy, NOT cancer per son  . Colon cancer Neg Hx     History  Substance Use Topics  . Smoking status: Former Smoker    Types: Cigarettes    Quit date: 11/30/2010  . Smokeless tobacco: Current User     Comment: uses electronic cigarettes PRN  . Alcohol Use: No    Review of Systems   As noted in HPI  Filed Vitals:   01/26/14 1507  BP: 129/86  Pulse: 99  Temp: 98.8 F (37.1 C)  Resp: 16    Physical Exam  Physical Exam  Eyes: EOM are normal. Pupils are equal, round, and reactive to light.  Cardiovascular: Normal rate and regular rhythm.   Pulmonary/Chest: Breath sounds normal. No respiratory distress. He has no wheezes. He has no rales.  Musculoskeletal:  Right wrist no erythema tenderness good range of motion, handgrip OK 2+ radial pulse    CBC    Component Value Date/Time   WBC 5.4 09/12/2013 1736   WBC 3.9* 03/01/2012 1105   RBC 4.24 09/12/2013 1736   RBC 4.54* 03/01/2012 1105   HGB 14.5  09/12/2013 1736   HGB 14.9 03/01/2012 1105   HCT 40.5 09/12/2013 1736   HCT 47.1 03/01/2012 1105   PLT 245 09/12/2013 1736   MCV 95.5 09/12/2013 1736   MCV 103.7* 03/01/2012 1105   LYMPHSABS 2.2 09/12/2013 1736   MONOABS 0.3 09/12/2013 1736   EOSABS 0.3 09/12/2013 1736   BASOSABS 0.1 09/12/2013 1736    CMP     Component Value Date/Time   NA 140 09/12/2013 1736   K 3.8 09/12/2013 1736   CL 98 09/12/2013 1736   CO2 31 09/12/2013 1736   GLUCOSE 98 09/12/2013 1736   BUN 10 09/12/2013 1736   CREATININE 1.26 09/12/2013 1736   CREATININE 1.15 10/01/2012 1548   CALCIUM 9.4 09/12/2013 1736   PROT 6.9 09/12/2013 1736   ALBUMIN 4.4 09/12/2013 1736   AST 21 09/12/2013 1736   ALT 19 09/12/2013 1736   ALKPHOS 57 09/12/2013 1736   BILITOT 0.6 09/12/2013 1736   GFRNONAA 63 09/12/2013 1736   GFRNONAA 70* 10/01/2012 1548   GFRAA 73 09/12/2013 1736   GFRAA 81* 10/01/2012 1548    Lab Results  Component Value Date/Time   CHOL 204* 09/12/2013  5:36 PM    No components found with this basename: hga1c    Lab Results  Component Value Date/Time   AST 21 09/12/2013  5:36 PM    Assessment and Plan  Essential hypertension The pressure is well controlled patient will continue with current medications also advise for DASH diet.  Wrist joint pain, right No history of trauma minimal symptoms advised patient to take ibuprofen when necessary.   Insomnia Advised patient for lifestyle modification and takes trazodone as well as melatonin each bedtime.    Return in about 3 months (around 04/28/2014) for hypertension.  Doris CheadleADVANI, Aleka Twitty, MD

## 2014-01-26 NOTE — Progress Notes (Signed)
Patient complains of pain to right wrist Denies any injury Patient feels he needs his blood pressure medication adjusted Has been  Breaking the losartan in half and taking half  Before bed and the other Half in the am

## 2014-01-26 NOTE — Patient Instructions (Signed)
DASH Eating Plan °DASH stands for "Dietary Approaches to Stop Hypertension." The DASH eating plan is a healthy eating plan that has been shown to reduce high blood pressure (hypertension). Additional health benefits may include reducing the risk of type 2 diabetes mellitus, heart disease, and stroke. The DASH eating plan may also help with weight loss. °WHAT DO I NEED TO KNOW ABOUT THE DASH EATING PLAN? °For the DASH eating plan, you will follow these general guidelines: °· Choose foods with a percent daily value for sodium of less than 5% (as listed on the food label). °· Use salt-free seasonings or herbs instead of table salt or sea salt. °· Check with your health care provider or pharmacist before using salt substitutes. °· Eat lower-sodium products, often labeled as "lower sodium" or "no salt added." °· Eat fresh foods. °· Eat more vegetables, fruits, and low-fat dairy products. °· Choose whole grains. Look for the word "whole" as the first word in the ingredient list. °· Choose fish and skinless chicken or turkey more often than red meat. Limit fish, poultry, and meat to 6 oz (170 g) each day. °· Limit sweets, desserts, sugars, and sugary drinks. °· Choose heart-healthy fats. °· Limit cheese to 1 oz (28 g) per day. °· Eat more home-cooked food and less restaurant, buffet, and fast food. °· Limit fried foods. °· Cook foods using methods other than frying. °· Limit canned vegetables. If you do use them, rinse them well to decrease the sodium. °· When eating at a restaurant, ask that your food be prepared with less salt, or no salt if possible. °WHAT FOODS CAN I EAT? °Seek help from a dietitian for individual calorie needs. °Grains °Whole grain or whole wheat bread. Brown rice. Whole grain or whole wheat pasta. Quinoa, bulgur, and whole grain cereals. Low-sodium cereals. Corn or whole wheat flour tortillas. Whole grain cornbread. Whole grain crackers. Low-sodium crackers. °Vegetables °Fresh or frozen vegetables  (raw, steamed, roasted, or grilled). Low-sodium or reduced-sodium tomato and vegetable juices. Low-sodium or reduced-sodium tomato sauce and paste. Low-sodium or reduced-sodium canned vegetables.  °Fruits °All fresh, canned (in natural juice), or frozen fruits. °Meat and Other Protein Products °Ground beef (85% or leaner), grass-fed beef, or beef trimmed of fat. Skinless chicken or turkey. Ground chicken or turkey. Pork trimmed of fat. All fish and seafood. Eggs. Dried beans, peas, or lentils. Unsalted nuts and seeds. Unsalted canned beans. °Dairy °Low-fat dairy products, such as skim or 1% milk, 2% or reduced-fat cheeses, low-fat ricotta or cottage cheese, or plain low-fat yogurt. Low-sodium or reduced-sodium cheeses. °Fats and Oils °Tub margarines without trans fats. Light or reduced-fat mayonnaise and salad dressings (reduced sodium). Avocado. Safflower, olive, or canola oils. Natural peanut or almond butter. °Other °Unsalted popcorn and pretzels. °The items listed above may not be a complete list of recommended foods or beverages. Contact your dietitian for more options. °WHAT FOODS ARE NOT RECOMMENDED? °Grains °White bread. White pasta. White rice. Refined cornbread. Bagels and croissants. Crackers that contain trans fat. °Vegetables °Creamed or fried vegetables. Vegetables in a cheese sauce. Regular canned vegetables. Regular canned tomato sauce and paste. Regular tomato and vegetable juices. °Fruits °Dried fruits. Canned fruit in light or heavy syrup. Fruit juice. °Meat and Other Protein Products °Fatty cuts of meat. Ribs, chicken wings, bacon, sausage, bologna, salami, chitterlings, fatback, hot dogs, bratwurst, and packaged luncheon meats. Salted nuts and seeds. Canned beans with salt. °Dairy °Whole or 2% milk, cream, half-and-half, and cream cheese. Whole-fat or sweetened yogurt. Full-fat   cheeses or blue cheese. Nondairy creamers and whipped toppings. Processed cheese, cheese spreads, or cheese  curds. °Condiments °Onion and garlic salt, seasoned salt, table salt, and sea salt. Canned and packaged gravies. Worcestershire sauce. Tartar sauce. Barbecue sauce. Teriyaki sauce. Soy sauce, including reduced sodium. Steak sauce. Fish sauce. Oyster sauce. Cocktail sauce. Horseradish. Ketchup and mustard. Meat flavorings and tenderizers. Bouillon cubes. Hot sauce. Tabasco sauce. Marinades. Taco seasonings. Relishes. °Fats and Oils °Butter, stick margarine, lard, shortening, ghee, and bacon fat. Coconut, palm kernel, or palm oils. Regular salad dressings. °Other °Pickles and olives. Salted popcorn and pretzels. °The items listed above may not be a complete list of foods and beverages to avoid. Contact your dietitian for more information. °WHERE CAN I FIND MORE INFORMATION? °[employer], [employer], [employer]: www.nhlbi.nih.gov/health/health-topics/topics/dash/ °Document Released: 06/01/2011 Document Revised: 10/27/2013 Document Reviewed: 04/16/2013 °Exit[employer]® Patient Information ©2015 [employer]. This information is not intended to replace advice given to you by your health care provider. Make sure you discuss any questions you have with your health care provider. ° °

## 2014-03-13 ENCOUNTER — Ambulatory Visit: Payer: No Typology Code available for payment source | Attending: Internal Medicine

## 2014-03-17 ENCOUNTER — Ambulatory Visit: Payer: Self-pay | Attending: Internal Medicine

## 2014-03-27 ENCOUNTER — Telehealth: Payer: Self-pay | Admitting: Emergency Medicine

## 2014-03-27 ENCOUNTER — Other Ambulatory Visit: Payer: Self-pay | Admitting: Emergency Medicine

## 2014-03-27 MED ORDER — TIOTROPIUM BROMIDE MONOHYDRATE 18 MCG IN CAPS: 18.0000 ug | ORAL_CAPSULE | Freq: Every day | RESPIRATORY_TRACT | Status: DC

## 2014-03-27 NOTE — Telephone Encounter (Signed)
Left message for pt to return call left on nurse line for Spiriva refill Medication reordered and e-scribed to [facility]

## 2014-04-16 ENCOUNTER — Telehealth: Payer: Self-pay | Admitting: Internal Medicine

## 2014-04-16 NOTE — Telephone Encounter (Signed)
Patient would like to be called about traZODone (DESYREL) 50 MG tablet; Patient takes medication to stay asleep and would like an evaluation because he is having to up the dosage to stay asleep through the night; please f/u with patient

## 2014-04-20 ENCOUNTER — Other Ambulatory Visit: Payer: Self-pay | Admitting: Emergency Medicine

## 2014-04-20 ENCOUNTER — Telehealth: Payer: Self-pay | Admitting: Emergency Medicine

## 2014-04-20 ENCOUNTER — Telehealth: Payer: Self-pay | Admitting: Internal Medicine

## 2014-04-20 DIAGNOSIS — Z72820 Sleep deprivation: Secondary | ICD-10-CM

## 2014-04-20 MED ORDER — TRAZODONE HCL 50 MG PO TABS: 50.0000 mg | ORAL_TABLET | Freq: Every evening | ORAL | Status: DC | PRN

## 2014-04-20 NOTE — Telephone Encounter (Signed)
Left message regarding pt increase/ refill Trazodone for sleep. Pt has f/u scheduled appt with Dr.Advani this week, which he can address medication increase at that time.

## 2014-04-24 ENCOUNTER — Ambulatory Visit (HOSPITAL_COMMUNITY)
Admission: RE | Admit: 2014-04-24 | Discharge: 2014-04-24 | Disposition: A | Discharge status: Home / Self Care | Payer: Self-pay | Source: Ambulatory Visit | Attending: Internal Medicine | Admitting: Internal Medicine

## 2014-04-24 ENCOUNTER — Encounter: Payer: Self-pay | Admitting: Internal Medicine

## 2014-04-24 ENCOUNTER — Ambulatory Visit: Payer: No Typology Code available for payment source | Attending: Internal Medicine | Admitting: Internal Medicine

## 2014-04-24 VITALS — BP 119/81 | HR 83 | Temp 98.3°F | Resp 16 | Wt 182.0 lb

## 2014-04-24 DIAGNOSIS — G4700 Insomnia, unspecified: Secondary | ICD-10-CM

## 2014-04-24 DIAGNOSIS — Z23 Encounter for immunization: Secondary | ICD-10-CM

## 2014-04-24 DIAGNOSIS — I10 Essential (primary) hypertension: Secondary | ICD-10-CM

## 2014-04-24 DIAGNOSIS — Z7982 Long term (current) use of aspirin: Secondary | ICD-10-CM | POA: Insufficient documentation

## 2014-04-24 DIAGNOSIS — M25531 Pain in right wrist: Secondary | ICD-10-CM | POA: Insufficient documentation

## 2014-04-24 DIAGNOSIS — Z87891 Personal history of nicotine dependence: Secondary | ICD-10-CM | POA: Insufficient documentation

## 2014-04-24 DIAGNOSIS — J449 Chronic obstructive pulmonary disease, unspecified: Secondary | ICD-10-CM

## 2014-04-24 DIAGNOSIS — J45909 Unspecified asthma, uncomplicated: Secondary | ICD-10-CM | POA: Insufficient documentation

## 2014-04-24 MED ORDER — TRAZODONE HCL 100 MG PO TABS: 100.0000 mg | ORAL_TABLET | Freq: Every evening | ORAL | Status: DC | PRN

## 2014-04-24 MED ORDER — IBUPROFEN 600 MG PO TABS: 600.0000 mg | ORAL_TABLET | Freq: Three times a day (TID) | ORAL | Status: DC | PRN

## 2014-04-24 NOTE — Progress Notes (Signed)
Patient complains of pain to right wrist that has not gotten any better Patient also states that his trazodone is not helping Sometimes he has to take two pills to help him sleep

## 2014-04-24 NOTE — Progress Notes (Signed)
MRN: [id number] Name: Joshua Powell  Sex: male Age: 58 y.o. DOB: 10/20/1955  Allergies: Review of patient's allergies indicates no known allergies.  Chief Complaint  Patient presents with  . Wrist Pain    right    HPI: Patient is 58 y.o. male who history of hypertension, COPD comes today for followup still complaining of right wrist pain as per patient he plays the drum and probably this frequent wrist , movement aggravates his symptoms, denies any fall or trauma, he also has been taking trazodone for insomnia as per patient recently has been taking 2 pills at night which helps him with the symptoms and is requesting to increase the dose of trazodone to 100 mg. Patient denies any more smoking cigarettes has been using Symbicort and Spiriva, denies any chest pain or shortness of breath.  Past Medical History  Diagnosis Date  . Asthma   . Hypertension   . COPD (chronic obstructive pulmonary disease)   . Allergy     Past Surgical History  Procedure Laterality Date  . No past surgeries        Medication List       This list is accurate as of: 04/24/14 12:53 PM.  Always use your most recent med list.               albuterol 108 (90 BASE) MCG/ACT inhaler  Commonly known as:  PROVENTIL HFA;VENTOLIN HFA  Inhale 2 puffs into the lungs every 6 (six) hours as needed for wheezing.     aspirin 81 MG tablet  Take 81 mg by mouth daily.     budesonide-formoterol 160-4.5 MCG/ACT inhaler  Commonly known as:  SYMBICORT  Inhale 2 puffs into the lungs 2 (two) times daily.     calcium carbonate 1250 MG tablet  Commonly known as:  OS-CAL - dosed in mg of elemental calcium  Take 1 tablet by mouth daily with breakfast.     cyanocobalamin 500 MCG tablet  Take 500 mcg by mouth daily.     vitamin B-12 100 MCG tablet  Commonly known as:  CYANOCOBALAMIN  Take 50 mcg by mouth daily.     fish oil-omega-3 fatty acids 1000 MG capsule  Take 1 g by mouth daily.     ibuprofen 600 MG  tablet  Commonly known as:  ADVIL,MOTRIN  Take 1 tablet (600 mg total) by mouth every 8 (eight) hours as needed.     ipratropium-albuterol 0.5-2.5 (3) MG/3ML Soln  Commonly known as:  DUONEB  Take 3 mLs by nebulization every 6 (six) hours as needed (shortness of breath).     loratadine 10 MG tablet  Commonly known as:  CLARITIN  Take 10 mg by mouth daily.     losartan-hydrochlorothiazide 100-12.5 MG per tablet  Commonly known as:  HYZAAR  Take 1 tablet by mouth daily.     Melatonin 10 MG Tabs  Take 1 tablet by mouth at bedtime.     MOVIPREP 100 G Solr  Generic drug:  peg 3350 powder  Take as directed     multivitamin tablet  Take 1 tablet by mouth daily.     tiotropium 18 MCG inhalation capsule  Commonly known as:  SPIRIVA  Place 1 capsule (18 mcg total) into inhaler and inhale daily.     traZODone 100 MG tablet  Commonly known as:  DESYREL  Take 1 tablet (100 mg total) by mouth at bedtime as needed for sleep.     vitamin E 400  UNIT capsule  Take 400 Units by mouth daily.        Meds ordered this encounter  Medications  . traZODone (DESYREL) 100 MG tablet    Sig: Take 1 tablet (100 mg total) by mouth at bedtime as needed for sleep.    Dispense:  30 tablet    Refill:  3  . ibuprofen (ADVIL,MOTRIN) 600 MG tablet    Sig: Take 1 tablet (600 mg total) by mouth every 8 (eight) hours as needed.    Dispense:  30 tablet    Refill:  1    Immunization History  Administered Date(s) Administered  . Influenza,inj,Quad PF,36+ Mos 04/24/2014    Family History  Problem Relation Age of Onset  . Hypertension Other   . Heart disease Father   . Colon polyps Father 76    "tumor removed" had colostomy, NOT cancer per son  . Colon cancer Neg Hx     History  Substance Use Topics  . Smoking status: Former Smoker    Types: Cigarettes    Quit date: 11/30/2010  . Smokeless tobacco: Current User     Comment: uses electronic cigarettes PRN  . Alcohol Use: No    Review of  Systems   As noted in HPI  Filed Vitals:   04/24/14 1227  BP: 119/81  Pulse: 83  Temp: 98.3 F (36.8 C)  Resp: 16    Physical Exam  Physical Exam  Constitutional: No distress.  Eyes: EOM are normal. Pupils are equal, round, and reactive to light.  Cardiovascular: Normal rate and regular rhythm.   Pulmonary/Chest: Breath sounds normal. No respiratory distress. He has no wheezes. He has no rales.  Musculoskeletal:  Right wrist no erythema tenderness good range of motion, handgrip OK 2+ radial pulse     CBC    Component Value Date/Time   WBC 5.4 09/12/2013 1736   WBC 3.9* 03/01/2012 1105   RBC 4.24 09/12/2013 1736   RBC 4.54* 03/01/2012 1105   HGB 14.5 09/12/2013 1736   HGB 14.9 03/01/2012 1105   HCT 40.5 09/12/2013 1736   HCT 47.1 03/01/2012 1105   PLT 245 09/12/2013 1736   MCV 95.5 09/12/2013 1736   MCV 103.7* 03/01/2012 1105   LYMPHSABS 2.2 09/12/2013 1736   MONOABS 0.3 09/12/2013 1736   EOSABS 0.3 09/12/2013 1736   BASOSABS 0.1 09/12/2013 1736    CMP     Component Value Date/Time   NA 140 09/12/2013 1736   K 3.8 09/12/2013 1736   CL 98 09/12/2013 1736   CO2 31 09/12/2013 1736   GLUCOSE 98 09/12/2013 1736   BUN 10 09/12/2013 1736   CREATININE 1.26 09/12/2013 1736   CREATININE 1.15 10/01/2012 1548   CALCIUM 9.4 09/12/2013 1736   PROT 6.9 09/12/2013 1736   ALBUMIN 4.4 09/12/2013 1736   AST 21 09/12/2013 1736   ALT 19 09/12/2013 1736   ALKPHOS 57 09/12/2013 1736   BILITOT 0.6 09/12/2013 1736   GFRNONAA 63 09/12/2013 1736   GFRNONAA 70* 10/01/2012 1548   GFRAA 73 09/12/2013 1736   GFRAA 81* 10/01/2012 1548    Lab Results  Component Value Date/Time   CHOL 204* 09/12/2013  5:36 PM    No components found with this basename: hga1c    Lab Results  Component Value Date/Time   AST 21 09/12/2013  5:36 PM    Assessment and Plan  Essential hypertension Blood pressure is well controlled, continue with current meds.  Wrist joint pain, right - Plan:  DG Wrist Complete Right, ibuprofen  (ADVIL,MOTRIN) 600 MG tablet  Insomnia - Plan: Advised patient for sleep hygiene, increase the dose traZODone (DESYREL) 100 MG tablet  Encounter for immunization Flu shot given today  COPD mixed type Symptoms are stable, continue with Symbicort, Spiriva as well as albuterol when necessary, patient is also given Pneumovax today.  Health Maintenance  -Vaccinations: Flu shot and Pneumovax today  Return in about 3 months (around 07/25/2014) for hypertension, COPD.  Doris CheadleADVANI, Gemma Ruan, MD

## 2014-05-08 ENCOUNTER — Telehealth: Payer: Self-pay | Admitting: Internal Medicine

## 2014-05-08 ENCOUNTER — Telehealth: Payer: Self-pay | Admitting: Emergency Medicine

## 2014-05-08 NOTE — Telephone Encounter (Signed)
Pt given negative xray result

## 2014-05-08 NOTE — Telephone Encounter (Signed)
Pt. Came into facility to request results of x-rays, please f/u with pt.

## 2014-06-05 ENCOUNTER — Other Ambulatory Visit: Payer: Self-pay | Admitting: Internal Medicine

## 2014-06-10 ENCOUNTER — Other Ambulatory Visit: Payer: Self-pay | Admitting: Internal Medicine

## 2014-09-01 ENCOUNTER — Other Ambulatory Visit: Payer: Self-pay | Admitting: Internal Medicine

## 2014-09-02 ENCOUNTER — Telehealth: Payer: Self-pay | Admitting: Emergency Medicine

## 2014-09-02 NOTE — Telephone Encounter (Signed)
Patient needs his 100MG  Trazadone RX refilled.

## 2014-09-03 ENCOUNTER — Telehealth: Payer: Self-pay

## 2014-09-03 ENCOUNTER — Other Ambulatory Visit: Payer: Self-pay

## 2014-09-03 DIAGNOSIS — G4700 Insomnia, unspecified: Secondary | ICD-10-CM

## 2014-09-03 MED ORDER — TRAZODONE HCL 100 MG PO TABS: 100.0000 mg | ORAL_TABLET | Freq: Every evening | ORAL | Status: DC | PRN

## 2014-09-03 NOTE — Telephone Encounter (Signed)
Patient called requesting a refill on his trazodone [facility]

## 2014-09-29 ENCOUNTER — Other Ambulatory Visit: Payer: Self-pay | Admitting: Internal Medicine

## 2014-10-02 ENCOUNTER — Telehealth: Payer: Self-pay | Admitting: *Deleted

## 2014-10-02 DIAGNOSIS — J4541 Moderate persistent asthma with (acute) exacerbation: Secondary | ICD-10-CM

## 2014-10-02 MED ORDER — BUDESONIDE-FORMOTEROL FUMARATE 160-4.5 MCG/ACT IN AERO: 2.0000 | INHALATION_SPRAY | Freq: Two times a day (BID) | RESPIRATORY_TRACT | Status: DC

## 2014-10-02 NOTE — Telephone Encounter (Signed)
Patient called in asking for current order for Symbicort be placed so that pharmacy could supply him with samples until his PASS comes through.  [facility]

## 2014-10-16 ENCOUNTER — Other Ambulatory Visit: Payer: Self-pay | Admitting: Internal Medicine

## 2014-10-16 DIAGNOSIS — J4541 Moderate persistent asthma with (acute) exacerbation: Secondary | ICD-10-CM

## 2014-10-16 MED ORDER — ALBUTEROL SULFATE HFA 108 (90 BASE) MCG/ACT IN AERS: 2.0000 | INHALATION_SPRAY | Freq: Four times a day (QID) | RESPIRATORY_TRACT | Status: DC | PRN

## 2014-10-16 MED ORDER — TIOTROPIUM BROMIDE MONOHYDRATE 18 MCG IN CAPS: 18.0000 ug | ORAL_CAPSULE | Freq: Every day | RESPIRATORY_TRACT | Status: DC

## 2014-10-16 MED ORDER — BUDESONIDE-FORMOTEROL FUMARATE 160-4.5 MCG/ACT IN AERO: 2.0000 | INHALATION_SPRAY | Freq: Two times a day (BID) | RESPIRATORY_TRACT | Status: DC

## 2014-10-30 ENCOUNTER — Other Ambulatory Visit: Payer: Self-pay | Admitting: Internal Medicine

## 2014-11-11 ENCOUNTER — Other Ambulatory Visit: Payer: Self-pay | Admitting: *Deleted

## 2014-11-11 NOTE — Telephone Encounter (Signed)
Patient called in for refills on his losartan and trazadone it appears refills already sent

## 2014-11-13 ENCOUNTER — Ambulatory Visit: Payer: Self-pay | Attending: Internal Medicine | Admitting: Internal Medicine

## 2014-11-13 ENCOUNTER — Encounter: Payer: Self-pay | Admitting: Internal Medicine

## 2014-11-13 VITALS — BP 152/80 | HR 76 | Temp 98.0°F | Resp 16 | Wt 180.2 lb

## 2014-11-13 DIAGNOSIS — F17200 Nicotine dependence, unspecified, uncomplicated: Secondary | ICD-10-CM

## 2014-11-13 DIAGNOSIS — F17210 Nicotine dependence, cigarettes, uncomplicated: Secondary | ICD-10-CM | POA: Insufficient documentation

## 2014-11-13 DIAGNOSIS — J449 Chronic obstructive pulmonary disease, unspecified: Secondary | ICD-10-CM | POA: Insufficient documentation

## 2014-11-13 DIAGNOSIS — Z7982 Long term (current) use of aspirin: Secondary | ICD-10-CM | POA: Insufficient documentation

## 2014-11-13 DIAGNOSIS — G4700 Insomnia, unspecified: Secondary | ICD-10-CM | POA: Insufficient documentation

## 2014-11-13 DIAGNOSIS — Z7951 Long term (current) use of inhaled steroids: Secondary | ICD-10-CM | POA: Insufficient documentation

## 2014-11-13 DIAGNOSIS — I10 Essential (primary) hypertension: Secondary | ICD-10-CM | POA: Insufficient documentation

## 2014-11-13 DIAGNOSIS — R252 Cramp and spasm: Secondary | ICD-10-CM | POA: Insufficient documentation

## 2014-11-13 DIAGNOSIS — J45909 Unspecified asthma, uncomplicated: Secondary | ICD-10-CM | POA: Insufficient documentation

## 2014-11-13 DIAGNOSIS — Z720 Tobacco use: Secondary | ICD-10-CM

## 2014-11-13 MED ORDER — ASPIRIN 81 MG PO TABS: 81.0000 mg | ORAL_TABLET | Freq: Every day | ORAL | Status: AC

## 2014-11-13 MED ORDER — TRAZODONE HCL 100 MG PO TABS: 100.0000 mg | ORAL_TABLET | Freq: Every evening | ORAL | Status: DC | PRN

## 2014-11-13 MED ORDER — ALBUTEROL SULFATE HFA 108 (90 BASE) MCG/ACT IN AERS: 2.0000 | INHALATION_SPRAY | Freq: Four times a day (QID) | RESPIRATORY_TRACT | Status: DC | PRN

## 2014-11-13 MED ORDER — BUDESONIDE-FORMOTEROL FUMARATE 160-4.5 MCG/ACT IN AERO: 2.0000 | INHALATION_SPRAY | Freq: Two times a day (BID) | RESPIRATORY_TRACT | Status: DC

## 2014-11-13 MED ORDER — IPRATROPIUM-ALBUTEROL 0.5-2.5 (3) MG/3ML IN SOLN: 3.0000 mL | Freq: Four times a day (QID) | RESPIRATORY_TRACT | Status: AC | PRN

## 2014-11-13 MED ORDER — LOSARTAN POTASSIUM-HCTZ 100-12.5 MG PO TABS: 1.0000 | ORAL_TABLET | Freq: Every day | ORAL | Status: DC

## 2014-11-13 NOTE — Progress Notes (Signed)
MRN: [id number] Name: Joshua Powell  Sex: male Age: 59 y.o. DOB: [birth year]  Allergies: Review of patient's allergies indicates no known allergies.  Chief Complaint  Patient presents with  . Follow-up    HPI: Patient is 59 y.o. male who has history of asthma/COPD, hypertension, insomnia, tobacco abuse, comes today for followup requesting refill on his medications, today's blood pressure is borderline elevated, denies any headache dizziness chest and shortness of breath, he still smokes cigarettes, I have again counseled patient to quit smoking, as per patient he recently he has been under a lot of stress that's why he smokes.patient is complaining of having lot of leg cramps.  Past Medical History  Diagnosis Date  . Asthma   . Hypertension   . COPD (chronic obstructive pulmonary disease)   . Allergy     Past Surgical History  Procedure Laterality Date  . No past surgeries        Medication List       This list is accurate as of: 11/13/14  5:11 PM.  Always use your most recent med list.               albuterol 108 (90 BASE) MCG/ACT inhaler  Commonly known as:  PROVENTIL HFA;VENTOLIN HFA  Inhale 2 puffs into the lungs every 6 (six) hours as needed for wheezing.     aspirin 81 MG tablet  Take 1 tablet (81 mg total) by mouth daily.     budesonide-formoterol 160-4.5 MCG/ACT inhaler  Commonly known as:  SYMBICORT  Inhale 2 puffs into the lungs 2 (two) times daily.     calcium carbonate 1250 (500 CA) MG tablet  Commonly known as:  OS-CAL - dosed in mg of elemental calcium  Take 1 tablet by mouth daily with breakfast.     cyanocobalamin 500 MCG tablet  Take 500 mcg by mouth daily.     vitamin B-12 100 MCG tablet  Commonly known as:  CYANOCOBALAMIN  Take 50 mcg by mouth daily.     fish oil-omega-3 fatty acids 1000 MG capsule  Take 1 g by mouth daily.     ibuprofen 600 MG tablet  Commonly known as:  ADVIL,MOTRIN  Take 1 tablet (600 mg total) by mouth every 8  (eight) hours as needed.     ipratropium-albuterol 0.5-2.5 (3) MG/3ML Soln  Commonly known as:  DUONEB  Take 3 mLs by nebulization every 6 (six) hours as needed (shortness of breath).     loratadine 10 MG tablet  Commonly known as:  CLARITIN  Take 10 mg by mouth daily.     losartan-hydrochlorothiazide 100-12.5 MG per tablet  Commonly known as:  HYZAAR  Take 1 tablet by mouth daily.     Melatonin 10 MG Tabs  Take 1 tablet by mouth at bedtime.     MOVIPREP 100 G Solr  Generic drug:  peg 3350 powder  Take as directed     multivitamin tablet  Take 1 tablet by mouth daily.     tiotropium 18 MCG inhalation capsule  Commonly known as:  SPIRIVA  Place 1 capsule (18 mcg total) into inhaler and inhale daily.     traZODone 100 MG tablet  Commonly known as:  DESYREL  Take 1 tablet (100 mg total) by mouth at bedtime as needed. for sleep     vitamin E 400 UNIT capsule  Take 400 Units by mouth daily.        Meds ordered this encounter  Medications  .  albuterol (PROVENTIL HFA;VENTOLIN HFA) 108 (90 BASE) MCG/ACT inhaler    Sig: Inhale 2 puffs into the lungs every 6 (six) hours as needed for wheezing.    Dispense:  3 Inhaler    Refill:  3  . aspirin 81 MG tablet    Sig: Take 1 tablet (81 mg total) by mouth daily.    Dispense:  30 tablet    Refill:  2  . budesonide-formoterol (SYMBICORT) 160-4.5 MCG/ACT inhaler    Sig: Inhale 2 puffs into the lungs 2 (two) times daily.    Dispense:  3 Inhaler    Refill:  3  . losartan-hydrochlorothiazide (HYZAAR) 100-12.5 MG per tablet    Sig: Take 1 tablet by mouth daily.    Dispense:  30 tablet    Refill:  3    Patient needs to schedule PCP appointment before getting additional refills.  . traZODone (DESYREL) 100 MG tablet    Sig: Take 1 tablet (100 mg total) by mouth at bedtime as needed. for sleep    Dispense:  30 tablet    Refill:  3  . ipratropium-albuterol (DUONEB) 0.5-2.5 (3) MG/3ML SOLN    Sig: Take 3 mLs by nebulization every 6  (six) hours as needed (shortness of breath).    Dispense:  360 mL    Refill:  3    Immunization History  Administered Date(s) Administered  . Influenza,inj,Quad PF,36+ Mos 04/24/2014  . Pneumococcal Polysaccharide-23 04/24/2014    Family History  Problem Relation Age of Onset  . Hypertension Other   . Heart disease Father   . Colon polyps Father 6975    "tumor removed" had colostomy, NOT cancer per son  . Colon cancer Neg Hx     History  Substance Use Topics  . Smoking status: Former Smoker    Types: Cigarettes    Quit date: 11/30/2010  . Smokeless tobacco: Current User     Comment: uses electronic cigarettes PRN  . Alcohol Use: No    Review of Systems   As noted in HPI  Filed Vitals:   11/13/14 1537  BP: 152/80  Pulse: 76  Temp: 98 F (36.7 C)  Resp: 16    Physical Exam  Physical Exam  Constitutional: No distress.  Eyes: EOM are normal. Pupils are equal, round, and reactive to light.  Cardiovascular: Normal rate and regular rhythm.   Pulmonary/Chest: Breath sounds normal. No respiratory distress. He has no wheezes. He has no rales.  Musculoskeletal: He exhibits no edema.    CBC    Component Value Date/Time   WBC 5.4 09/12/2013 1736   WBC 3.9* 03/01/2012 1105   RBC 4.24 09/12/2013 1736   RBC 4.54* 03/01/2012 1105   HGB 14.5 09/12/2013 1736   HGB 14.9 03/01/2012 1105   HCT 40.5 09/12/2013 1736   HCT 47.1 03/01/2012 1105   PLT 245 09/12/2013 1736   MCV 95.5 09/12/2013 1736   MCV 103.7* 03/01/2012 1105   LYMPHSABS 2.2 09/12/2013 1736   MONOABS 0.3 09/12/2013 1736   EOSABS 0.3 09/12/2013 1736   BASOSABS 0.1 09/12/2013 1736    CMP     Component Value Date/Time   NA 140 09/12/2013 1736   K 3.8 09/12/2013 1736   CL 98 09/12/2013 1736   CO2 31 09/12/2013 1736   GLUCOSE 98 09/12/2013 1736   BUN 10 09/12/2013 1736   CREATININE 1.26 09/12/2013 1736   CREATININE 1.15 10/01/2012 1548   CALCIUM 9.4 09/12/2013 1736   PROT 6.9 09/12/2013 1736  ALBUMIN 4.4 09/12/2013 1736   AST 21 09/12/2013 1736   ALT 19 09/12/2013 1736   ALKPHOS 57 09/12/2013 1736   BILITOT 0.6 09/12/2013 1736   GFRNONAA 63 09/12/2013 1736   GFRNONAA 70* 10/01/2012 1548   GFRAA 73 09/12/2013 1736   GFRAA 81* 10/01/2012 1548    Lab Results  Component Value Date/Time   CHOL 204* 09/12/2013 05:36 PM    Lab Results  Component Value Date/Time   HGBA1C 4.8 10/01/2012 03:48 PM    Lab Results  Component Value Date/Time   AST 21 09/12/2013 05:36 PM    Assessment and Plan  Essential hypertension - Plan: advised patient for DASH diet, continue with losartan-hydrochlorothiazide (HYZAAR) 100-12.5 MG per tablet, will check blood chemistry  Chronic obstructive pulmonary disease, unspecified COPD, unspecified chronic bronchitis type - Plan: albuterol (PROVENTIL HFA;VENTOLIN HFA) 108 (90 BASE) MCG/ACT inhaler, budesonide-formoterol (SYMBICORT) 160-4.5 MCG/ACT inhaler, ipratropium-albuterol (DUONEB) 0.5-2.5 (3) MG/3ML SOLN  Insomnia - Plan: traZODone (DESYREL) 100 MG tablet  Smoking Counseled patient to quit smoking  Cramps of lower extremity, unspecified laterality - Plan: COMPLETE METABOLIC PANEL WITH GFR, Vit D  25 hydroxy (rtn osteoporosis monitoring)    Return in about 3 months (around 02/13/2015), or if symptoms worsen or fail to improve, for hypertension.   This note has been created with Education officer, environmentalDragon speech recognition software and smart phrase technology. Any transcriptional errors are unintentional.    Doris CheadleADVANI, Johnhenry Tippin, MD

## 2014-11-13 NOTE — Progress Notes (Signed)
Patient here for follow up Patient has been under a lot of stress due to the loss of both parents in a short time His asthma has been bothering him and having some breathing issues Patient would like some medications for nebulizer machine at home Requesting refills on his other medications as well

## 2014-11-13 NOTE — Patient Instructions (Signed)
DASH Eating Plan °DASH stands for "Dietary Approaches to Stop Hypertension." The DASH eating plan is a healthy eating plan that has been shown to reduce high blood pressure (hypertension). Additional health benefits may include reducing the risk of type 2 diabetes mellitus, heart disease, and stroke. The DASH eating plan may also help with weight loss. °WHAT DO I NEED TO KNOW ABOUT THE DASH EATING PLAN? °For the DASH eating plan, you will follow these general guidelines: °· Choose foods with a percent daily value for sodium of less than 5% (as listed on the food label). °· Use salt-free seasonings or herbs instead of table salt or sea salt. °· Check with your health care provider or pharmacist before using salt substitutes. °· Eat lower-sodium products, often labeled as "lower sodium" or "no salt added." °· Eat fresh foods. °· Eat more vegetables, fruits, and low-fat dairy products. °· Choose whole grains. Look for the word "whole" as the first word in the ingredient list. °· Choose fish and skinless chicken or turkey more often than red meat. Limit fish, poultry, and meat to 6 oz (170 g) each day. °· Limit sweets, desserts, sugars, and sugary drinks. °· Choose heart-healthy fats. °· Limit cheese to 1 oz (28 g) per day. °· Eat more home-cooked food and less restaurant, buffet, and fast food. °· Limit fried foods. °· Cook foods using methods other than frying. °· Limit canned vegetables. If you do use them, rinse them well to decrease the sodium. °· When eating at a restaurant, ask that your food be prepared with less salt, or no salt if possible. °WHAT FOODS CAN I EAT? °Seek help from a dietitian for individual calorie needs. °Grains °Whole grain or whole wheat bread. Brown rice. Whole grain or whole wheat pasta. Quinoa, bulgur, and whole grain cereals. Low-sodium cereals. Corn or whole wheat flour tortillas. Whole grain cornbread. Whole grain crackers. Low-sodium crackers. °Vegetables °Fresh or frozen vegetables  (raw, steamed, roasted, or grilled). Low-sodium or reduced-sodium tomato and vegetable juices. Low-sodium or reduced-sodium tomato sauce and paste. Low-sodium or reduced-sodium canned vegetables.  °Fruits °All fresh, canned (in natural juice), or frozen fruits. °Meat and Other Protein Products °Ground beef (85% or leaner), grass-fed beef, or beef trimmed of fat. Skinless chicken or turkey. Ground chicken or turkey. Pork trimmed of fat. All fish and seafood. Eggs. Dried beans, peas, or lentils. Unsalted nuts and seeds. Unsalted canned beans. °Dairy °Low-fat dairy products, such as skim or 1% milk, 2% or reduced-fat cheeses, low-fat ricotta or cottage cheese, or plain low-fat yogurt. Low-sodium or reduced-sodium cheeses. °Fats and Oils °Tub margarines without trans fats. Light or reduced-fat mayonnaise and salad dressings (reduced sodium). Avocado. Safflower, olive, or canola oils. Natural peanut or almond butter. °Other °Unsalted popcorn and pretzels. °The items listed above may not be a complete list of recommended foods or beverages. Contact your dietitian for more options. °WHAT FOODS ARE NOT RECOMMENDED? °Grains °White bread. White pasta. White rice. Refined cornbread. Bagels and croissants. Crackers that contain trans fat. °Vegetables °Creamed or fried vegetables. Vegetables in a cheese sauce. Regular canned vegetables. Regular canned tomato sauce and paste. Regular tomato and vegetable juices. °Fruits °Dried fruits. Canned fruit in light or heavy syrup. Fruit juice. °Meat and Other Protein Products °Fatty cuts of meat. Ribs, chicken wings, bacon, sausage, bologna, salami, chitterlings, fatback, hot dogs, bratwurst, and packaged luncheon meats. Salted nuts and seeds. Canned beans with salt. °Dairy °Whole or 2% milk, cream, half-and-half, and cream cheese. Whole-fat or sweetened yogurt. Full-fat   cheeses or blue cheese. Nondairy creamers and whipped toppings. Processed cheese, cheese spreads, or cheese  curds. °Condiments °Onion and garlic salt, seasoned salt, table salt, and sea salt. Canned and packaged gravies. Worcestershire sauce. Tartar sauce. Barbecue sauce. Teriyaki sauce. Soy sauce, including reduced sodium. Steak sauce. Fish sauce. Oyster sauce. Cocktail sauce. Horseradish. Ketchup and mustard. Meat flavorings and tenderizers. Bouillon cubes. Hot sauce. Tabasco sauce. Marinades. Taco seasonings. Relishes. °Fats and Oils °Butter, stick margarine, lard, shortening, ghee, and bacon fat. Coconut, palm kernel, or palm oils. Regular salad dressings. °Other °Pickles and olives. Salted popcorn and pretzels. °The items listed above may not be a complete list of foods and beverages to avoid. Contact your dietitian for more information. °WHERE CAN I FIND MORE INFORMATION? °[employer], [employer], [employer]: www.nhlbi.nih.gov/health/health-topics/topics/dash/ °Document Released: 06/01/2011 Document Revised: 10/27/2013 Document Reviewed: 04/16/2013 °Exit[employer]® Patient Information ©2015 [employer]. This information is not intended to replace advice given to you by your health care provider. Make sure you discuss any questions you have with your health care provider. ° °

## 2014-11-14 LAB — COMPLETE METABOLIC PANEL WITH GFR
ALK PHOS: 73 U/L (ref 39–117)
ALT: 19 U/L (ref 0–53)
AST: 24 U/L (ref 0–37)
Albumin: 4.4 g/dL (ref 3.5–5.2)
BUN: 10 mg/dL (ref 6–23)
CO2: 30 meq/L (ref 19–32)
CREATININE: 1.33 mg/dL (ref 0.50–1.35)
Calcium: 9.7 mg/dL (ref 8.4–10.5)
Chloride: 98 mEq/L (ref 96–112)
GFR, Est African American: 68 mL/min
GFR, Est Non African American: 59 mL/min — ABNORMAL LOW
Glucose, Bld: 39 mg/dL — CL (ref 70–99)
Potassium: 4.5 mEq/L (ref 3.5–5.3)
SODIUM: 138 meq/L (ref 135–145)
TOTAL PROTEIN: 7.1 g/dL (ref 6.0–8.3)
Total Bilirubin: 0.7 mg/dL (ref 0.2–1.2)

## 2014-11-14 LAB — VITAMIN D 25 HYDROXY (VIT D DEFICIENCY, FRACTURES): Vit D, 25-Hydroxy: 32 ng/mL (ref 30–100)

## 2014-11-16 ENCOUNTER — Other Ambulatory Visit: Payer: Self-pay

## 2014-11-16 ENCOUNTER — Telehealth: Payer: Self-pay

## 2014-11-16 NOTE — Progress Notes (Unsigned)
Patient came in to office to recheck his blood sugar Today blood sugar is 85

## 2014-11-16 NOTE — Telephone Encounter (Signed)
Spoke with patient this am He will stop by the office this am so that  We can recheck his blood sugar

## 2014-12-01 ENCOUNTER — Ambulatory Visit: Payer: Self-pay | Attending: Internal Medicine | Admitting: Internal Medicine

## 2014-12-01 ENCOUNTER — Encounter: Payer: Self-pay | Admitting: Internal Medicine

## 2014-12-01 VITALS — BP 138/85 | HR 95 | Temp 97.9°F | Resp 16 | Wt 180.0 lb

## 2014-12-01 DIAGNOSIS — E162 Hypoglycemia, unspecified: Secondary | ICD-10-CM

## 2014-12-01 DIAGNOSIS — Z7982 Long term (current) use of aspirin: Secondary | ICD-10-CM | POA: Insufficient documentation

## 2014-12-01 DIAGNOSIS — H109 Unspecified conjunctivitis: Secondary | ICD-10-CM | POA: Insufficient documentation

## 2014-12-01 DIAGNOSIS — I10 Essential (primary) hypertension: Secondary | ICD-10-CM | POA: Insufficient documentation

## 2014-12-01 DIAGNOSIS — J449 Chronic obstructive pulmonary disease, unspecified: Secondary | ICD-10-CM | POA: Insufficient documentation

## 2014-12-01 DIAGNOSIS — J45909 Unspecified asthma, uncomplicated: Secondary | ICD-10-CM | POA: Insufficient documentation

## 2014-12-01 DIAGNOSIS — Z91048 Other nonmedicinal substance allergy status: Secondary | ICD-10-CM

## 2014-12-01 DIAGNOSIS — F17210 Nicotine dependence, cigarettes, uncomplicated: Secondary | ICD-10-CM | POA: Insufficient documentation

## 2014-12-01 DIAGNOSIS — J302 Other seasonal allergic rhinitis: Secondary | ICD-10-CM | POA: Insufficient documentation

## 2014-12-01 DIAGNOSIS — Z9109 Other allergy status, other than to drugs and biological substances: Secondary | ICD-10-CM

## 2014-12-01 DIAGNOSIS — Z720 Tobacco use: Secondary | ICD-10-CM

## 2014-12-01 DIAGNOSIS — Z7951 Long term (current) use of inhaled steroids: Secondary | ICD-10-CM | POA: Insufficient documentation

## 2014-12-01 DIAGNOSIS — F17200 Nicotine dependence, unspecified, uncomplicated: Secondary | ICD-10-CM

## 2014-12-01 LAB — GLUCOSE, POCT (MANUAL RESULT ENTRY): POC Glucose: 98 mg/dl (ref 70–99)

## 2014-12-01 MED ORDER — POLYMYXIN B-TRIMETHOPRIM 10000-0.1 UNIT/ML-% OP SOLN: 1.0000 [drp] | OPHTHALMIC | Status: AC

## 2014-12-01 MED ORDER — VARENICLINE TARTRATE 0.5 MG X 11 & 1 MG X 42 PO MISC: ORAL | Status: DC

## 2014-12-01 NOTE — Progress Notes (Signed)
MRN: [id number] Name: Joshua Powell  Sex: male Age: 59 y.o. DOB: 02/07/1956  Allergies: Review of patient's allergies indicates no known allergies.  Chief Complaint  Patient presents with  . Eye Pain    HPI: Patient is 59 y.o. male who history of environmental allergies , as per patient her father last 2 days he has no swelling and redness in the left eye denies any fever chills, his eye was sticky in the morning had minimal discharge,denies any change in vision, denies any sore throat chest pain or shortness of breath, patient does smoke cigarettes and would like to quit smoking, he has tried Chantix in the past and is requesting the prescription.  Past Medical History  Diagnosis Date  . Asthma   . Hypertension   . COPD (chronic obstructive pulmonary disease)   . Allergy     Past Surgical History  Procedure Laterality Date  . No past surgeries        Medication List       This list is accurate as of: 12/01/14  3:15 PM.  Always use your most recent med list.               albuterol 108 (90 BASE) MCG/ACT inhaler  Commonly known as:  PROVENTIL HFA;VENTOLIN HFA  Inhale 2 puffs into the lungs every 6 (six) hours as needed for wheezing.     aspirin 81 MG tablet  Take 1 tablet (81 mg total) by mouth daily.     budesonide-formoterol 160-4.5 MCG/ACT inhaler  Commonly known as:  SYMBICORT  Inhale 2 puffs into the lungs 2 (two) times daily.     calcium carbonate 1250 (500 CA) MG tablet  Commonly known as:  OS-CAL - dosed in mg of elemental calcium  Take 1 tablet by mouth daily with breakfast.     cyanocobalamin 500 MCG tablet  Take 500 mcg by mouth daily.     vitamin B-12 100 MCG tablet  Commonly known as:  CYANOCOBALAMIN  Take 50 mcg by mouth daily.     fish oil-omega-3 fatty acids 1000 MG capsule  Take 1 g by mouth daily.     ibuprofen 600 MG tablet  Commonly known as:  ADVIL,MOTRIN  Take 1 tablet (600 mg total) by mouth every 8 (eight) hours as needed.     ipratropium-albuterol 0.5-2.5 (3) MG/3ML Soln  Commonly known as:  DUONEB  Take 3 mLs by nebulization every 6 (six) hours as needed (shortness of breath).     loratadine 10 MG tablet  Commonly known as:  CLARITIN  Take 10 mg by mouth daily.     losartan-hydrochlorothiazide 100-12.5 MG per tablet  Commonly known as:  HYZAAR  Take 1 tablet by mouth daily.     Melatonin 10 MG Tabs  Take 1 tablet by mouth at bedtime.     MOVIPREP 100 G Solr  Generic drug:  peg 3350 powder  Take as directed     multivitamin tablet  Take 1 tablet by mouth daily.     tiotropium 18 MCG inhalation capsule  Commonly known as:  SPIRIVA  Place 1 capsule (18 mcg total) into inhaler and inhale daily.     traZODone 100 MG tablet  Commonly known as:  DESYREL  Take 1 tablet (100 mg total) by mouth at bedtime as needed. for sleep     trimethoprim-polymyxin b ophthalmic solution  Commonly known as:  POLYTRIM  Place 1 drop into the left eye every 4 (four) hours.  varenicline 0.5 MG X 11 & 1 MG X 42 tablet  Commonly known as:  CHANTIX STARTING MONTH PAK  Take one 0.5 mg tablet by mouth once daily for 3 days, then increase to one 0.5 mg tablet twice daily for 4 days, then increase to one 1 mg tablet twice daily.     vitamin E 400 UNIT capsule  Take 400 Units by mouth daily.        Meds ordered this encounter  Medications  . trimethoprim-polymyxin b (POLYTRIM) ophthalmic solution    Sig: Place 1 drop into the left eye every 4 (four) hours.    Dispense:  10 mL    Refill:  0  . varenicline (CHANTIX STARTING MONTH PAK) 0.5 MG X 11 & 1 MG X 42 tablet    Sig: Take one 0.5 mg tablet by mouth once daily for 3 days, then increase to one 0.5 mg tablet twice daily for 4 days, then increase to one 1 mg tablet twice daily.    Dispense:  53 tablet    Refill:  0    Immunization History  Administered Date(s) Administered  . Influenza,inj,Quad PF,36+ Mos 04/24/2014  . Pneumococcal Polysaccharide-23 04/24/2014      Family History  Problem Relation Age of Onset  . Hypertension Other   . Heart disease Father   . Colon polyps Father 33    "tumor removed" had colostomy, NOT cancer per son  . Colon cancer Neg Hx     History  Substance Use Topics  . Smoking status: Former Smoker    Types: Cigarettes    Quit date: 11/30/2010  . Smokeless tobacco: Current User     Comment: uses electronic cigarettes PRN  . Alcohol Use: No    Review of Systems   As noted in HPI  Filed Vitals:   12/01/14 1448  BP: 138/85  Pulse: 95  Temp: 97.9 F (36.6 C)  Resp: 16    Physical Exam  Physical Exam  HENT:  Both TMs visualized not congested  Eyes: EOM are normal. Pupils are equal, round, and reactive to light.  Left eye conjunctival injection, no apparent discharge  Cardiovascular: Normal rate and regular rhythm.   Pulmonary/Chest: Breath sounds normal. No respiratory distress. He has no wheezes. He has no rales.    CBC    Component Value Date/Time   WBC 5.4 09/12/2013 1736   WBC 3.9* 03/01/2012 1105   RBC 4.24 09/12/2013 1736   RBC 4.54* 03/01/2012 1105   HGB 14.5 09/12/2013 1736   HGB 14.9 03/01/2012 1105   HCT 40.5 09/12/2013 1736   HCT 47.1 03/01/2012 1105   PLT 245 09/12/2013 1736   MCV 95.5 09/12/2013 1736   MCV 103.7* 03/01/2012 1105   LYMPHSABS 2.2 09/12/2013 1736   MONOABS 0.3 09/12/2013 1736   EOSABS 0.3 09/12/2013 1736   BASOSABS 0.1 09/12/2013 1736    CMP     Component Value Date/Time   NA 138 11/13/2014 1612   K 4.5 11/13/2014 1612   CL 98 11/13/2014 1612   CO2 30 11/13/2014 1612   GLUCOSE 39* 11/13/2014 1612   BUN 10 11/13/2014 1612   CREATININE 1.33 11/13/2014 1612   CREATININE 1.15 10/01/2012 1548   CALCIUM 9.7 11/13/2014 1612   PROT 7.1 11/13/2014 1612   ALBUMIN 4.4 11/13/2014 1612   AST 24 11/13/2014 1612   ALT 19 11/13/2014 1612   ALKPHOS 73 11/13/2014 1612   BILITOT 0.7 11/13/2014 1612   GFRNONAA 59* 11/13/2014 1612  GFRNONAA 70* 10/01/2012 1548    GFRAA 68 11/13/2014 1612   GFRAA 81* 10/01/2012 1548    Lab Results  Component Value Date/Time   CHOL 204* 09/12/2013 05:36 PM    Lab Results  Component Value Date/Time   HGBA1C 4.8 10/01/2012 03:48 PM    Lab Results  Component Value Date/Time   AST 24 11/13/2014 04:12 PM    Assessment and Plan  Conjunctivitis of left eye - Plan:patient is advised for eye hygiene, prescribed trimethoprim-polymyxin b (POLYTRIM) ophthalmic solution  Hypoglycemia - Plan:  Results for orders placed or performed in visit on 12/01/14  Glucose (CBG)  Result Value Ref Range   POC Glucose 98.0 70 - 99 mg/dl   The last visit patient's blood sugar was lower today it is 98 milligram per deciliter.  Smoking - Plan: varenicline (CHANTIX STARTING MONTH PAK) 0.5 MG X 11 & 1 MG X 42 tablet  Environmental allergies Patient to start taking over-the-counter Claritin or Zyrtec.  Return in about 3 months (around 03/03/2015), or if symptoms worsen or fail to improve.   This note has been created with Education officer, environmental. Any transcriptional errors are unintentional.    Doris Cheadle, MD

## 2014-12-01 NOTE — Progress Notes (Signed)
Patient complains of left eye red irritated and swollen around the outside Patient does suffer from seasonal allergies but the eye itself is a little painful

## 2014-12-21 ENCOUNTER — Emergency Department (HOSPITAL_COMMUNITY)
Admission: EM | Admit: 2014-12-21 | Discharge: 2014-12-21 | Disposition: A | Discharge status: Home / Self Care | Payer: Self-pay | Attending: Emergency Medicine | Admitting: Emergency Medicine

## 2014-12-21 ENCOUNTER — Encounter (HOSPITAL_COMMUNITY): Payer: Self-pay | Admitting: Family Medicine

## 2014-12-21 DIAGNOSIS — L02415 Cutaneous abscess of right lower limb: Secondary | ICD-10-CM | POA: Insufficient documentation

## 2014-12-21 DIAGNOSIS — Z87891 Personal history of nicotine dependence: Secondary | ICD-10-CM | POA: Insufficient documentation

## 2014-12-21 DIAGNOSIS — Z7982 Long term (current) use of aspirin: Secondary | ICD-10-CM | POA: Insufficient documentation

## 2014-12-21 DIAGNOSIS — Z79899 Other long term (current) drug therapy: Secondary | ICD-10-CM | POA: Insufficient documentation

## 2014-12-21 DIAGNOSIS — J441 Chronic obstructive pulmonary disease with (acute) exacerbation: Secondary | ICD-10-CM | POA: Insufficient documentation

## 2014-12-21 DIAGNOSIS — I10 Essential (primary) hypertension: Secondary | ICD-10-CM | POA: Insufficient documentation

## 2014-12-21 DIAGNOSIS — Z7952 Long term (current) use of systemic steroids: Secondary | ICD-10-CM | POA: Insufficient documentation

## 2014-12-21 DIAGNOSIS — J449 Chronic obstructive pulmonary disease, unspecified: Secondary | ICD-10-CM

## 2014-12-21 DIAGNOSIS — J45901 Unspecified asthma with (acute) exacerbation: Secondary | ICD-10-CM

## 2014-12-21 MED ORDER — CEPHALEXIN 500 MG PO CAPS: 500.0000 mg | ORAL_CAPSULE | Freq: Four times a day (QID) | ORAL | Status: DC

## 2014-12-21 MED ORDER — LIDOCAINE HCL (PF) 1 % IJ SOLN
5.0000 mL | Freq: Once | INTRAMUSCULAR | Status: AC
  Administered 2014-12-21: 5 mL
  Filled 2014-12-21: qty 5

## 2014-12-21 MED ORDER — PREDNISONE 20 MG PO TABS: 20.0000 mg | ORAL_TABLET | Freq: Every day | ORAL | Status: DC

## 2014-12-21 MED ORDER — SODIUM BICARBONATE 4 % IV SOLN
5.0000 mL | Freq: Once | INTRAVENOUS | Status: AC
  Administered 2014-12-21: 5 mL via SUBCUTANEOUS
  Filled 2014-12-21: qty 5

## 2014-12-21 MED ORDER — SULFAMETHOXAZOLE-TRIMETHOPRIM 800-160 MG PO TABS: 1.0000 | ORAL_TABLET | Freq: Three times a day (TID) | ORAL | Status: DC

## 2014-12-21 NOTE — ED Provider Notes (Addendum)
CSN: [id number]     Arrival date & time 12/21/14  1150 History   First MD Initiated Contact with Patient 12/21/14 1211     Chief Complaint  Patient presents with  . Abscess  . Asthma     (Consider location/radiation/quality/duration/timing/severity/associated sxs/prior Treatment) Patient is a 59 y.o. male presenting with abscess and asthma. The history is provided by the patient.  Abscess Associated symptoms: no fever   Asthma Associated symptoms include shortness of breath. Pertinent negatives include no abdominal pain.   patient since was shortness of breath and feels his asthma is flaring up. States he thinks is due to stress. He states he is out of his Symbicort. No fevers. Occasional cough. States he is under a lot of stress due to some family illness. No chest pain. Swelling in his legs. He does not smoke. Also has had an abscess on his right inner thigh for the last 3 days. States at worst couple days ago. No drainage. No previous abscesses. No fevers.  Past Medical History  Diagnosis Date  . Asthma   . Hypertension   . COPD (chronic obstructive pulmonary disease)   . Allergy    Past Surgical History  Procedure Laterality Date  . No past surgeries     Family History  Problem Relation Age of Onset  . Hypertension Other   . Heart disease Father   . Colon polyps Father 63    "tumor removed" had colostomy, NOT cancer per son  . Colon cancer Neg Hx    History  Substance Use Topics  . Smoking status: Former Smoker    Types: Cigarettes    Quit date: 11/30/2010  . Smokeless tobacco: Current User     Comment: uses electronic cigarettes PRN  . Alcohol Use: No    Review of Systems  Constitutional: Negative for fever and diaphoresis.  Respiratory: Positive for shortness of breath and wheezing.   Gastrointestinal: Negative for abdominal pain.  Genitourinary: Negative for frequency.  Skin: Positive for wound.  Hematological: Does not bruise/bleed easily.   Psychiatric/Behavioral: Negative for behavioral problems.      Allergies  Review of patient's allergies indicates no known allergies.  Home Medications   Prior to Admission medications   Medication Sig Start Date End Date Taking? Authorizing Provider  albuterol (PROVENTIL HFA;VENTOLIN HFA) 108 (90 BASE) MCG/ACT inhaler Inhale 2 puffs into the lungs every 6 (six) hours as needed for wheezing. 11/13/14   Doris Cheadle, MD  aspirin 81 MG tablet Take 1 tablet (81 mg total) by mouth daily. 11/13/14   Doris Cheadle, MD  budesonide-formoterol (SYMBICORT) 160-4.5 MCG/ACT inhaler Inhale 2 puffs into the lungs 2 (two) times daily. 11/13/14   Doris Cheadle, MD  calcium carbonate (OS-CAL - DOSED IN MG OF ELEMENTAL CALCIUM) 1250 MG tablet Take 1 tablet by mouth daily with breakfast.    Historical Provider, MD  cephALEXin (KEFLEX) 500 MG capsule Take 1 capsule (500 mg total) by mouth 4 (four) times daily. 12/21/14   Benjiman Core, MD  cyanocobalamin 500 MCG tablet Take 500 mcg by mouth daily.    Historical Provider, MD  fish oil-omega-3 fatty acids 1000 MG capsule Take 1 g by mouth daily.    Historical Provider, MD  ibuprofen (ADVIL,MOTRIN) 600 MG tablet Take 1 tablet (600 mg total) by mouth every 8 (eight) hours as needed. 04/24/14   Doris Cheadle, MD  ipratropium-albuterol (DUONEB) 0.5-2.5 (3) MG/3ML SOLN Take 3 mLs by nebulization every 6 (six) hours as needed (shortness of breath).  11/13/14   Doris Cheadleeepak Advani, MD  loratadine (CLARITIN) 10 MG tablet Take 10 mg by mouth daily.    Historical Provider, MD  losartan-hydrochlorothiazide (HYZAAR) 100-12.5 MG per tablet Take 1 tablet by mouth daily. 11/13/14   Doris Cheadleeepak Advani, MD  Melatonin 10 MG TABS Take 1 tablet by mouth at bedtime.    Historical Provider, MD  MOVIPREP 100 G SOLR Take as directed 10/22/13   Hilarie FredricksonJohn N Perry, MD  Multiple Vitamin (MULTIVITAMIN) tablet Take 1 tablet by mouth daily.    Historical Provider, MD  predniSONE (DELTASONE) 20 MG tablet Take 1  tablet (20 mg total) by mouth daily. 12/21/14   Benjiman CoreNathan Ayven Glasco, MD  sulfamethoxazole-trimethoprim (BACTRIM DS,SEPTRA DS) 800-160 MG per tablet Take 1 tablet by mouth 3 (three) times daily. 12/21/14   Benjiman CoreNathan Chanise Habeck, MD  tiotropium (SPIRIVA) 18 MCG inhalation capsule Place 1 capsule (18 mcg total) into inhaler and inhale daily. 10/16/14   Quentin Angstlugbemiga E Jegede, MD  traZODone (DESYREL) 100 MG tablet Take 1 tablet (100 mg total) by mouth at bedtime as needed. for sleep 11/13/14   Doris Cheadleeepak Advani, MD  trimethoprim-polymyxin b (POLYTRIM) ophthalmic solution Place 1 drop into the left eye every 4 (four) hours. 12/01/14   Doris Cheadleeepak Advani, MD  varenicline (CHANTIX STARTING MONTH PAK) 0.5 MG X 11 & 1 MG X 42 tablet Take one 0.5 mg tablet by mouth once daily for 3 days, then increase to one 0.5 mg tablet twice daily for 4 days, then increase to one 1 mg tablet twice daily. 12/01/14   Doris Cheadleeepak Advani, MD  vitamin B-12 (CYANOCOBALAMIN) 100 MCG tablet Take 50 mcg by mouth daily.    Historical Provider, MD  vitamin E 400 UNIT capsule Take 400 Units by mouth daily.    Historical Provider, MD   BP 142/89 mmHg  Pulse 110  Temp(Src) 98.4 F (36.9 C) (Oral)  Resp 20  Ht 5\' 8"  (1.727 m)  Wt 180 lb (81.647 kg)  BMI 27.38 kg/m2  SpO2 96% Physical Exam  Constitutional: He appears well-developed.  Cardiovascular: Normal rate and regular rhythm.   Pulmonary/Chest: He has wheezes.  Mild wheezes  Abdominal: Soft.  Musculoskeletal: Normal range of motion.  Neurological: He is alert.  Skin:  Right inner thigh somewhat proximally has approximate 3 cm fluctuant area that is somewhat firm. It is erythematous and surrounded by red firmness for another centimeter 2.    ED Course  Procedures (including critical care time) Labs Review Labs Reviewed - No data to display  Imaging Review No results found.   EKG Interpretation None      MDM   Final diagnoses:  Abscess of right thigh  Asthma exacerbation    Patient  with asthma exacerbation. Has been out of his medications and will give short course of oral sterile it's since he cannot afford the medicine. Also give antibiotics to cover the surrounding cellulitis around his abscess. Abscess was drained. Will discharge home. States [facility].    Benjiman CoreNathan Lashonta Pilling, MD 12/21/14 1449  INCISION AND DRAINAGE Performed by: Billee CashingPICKERING,Fariha Goto R. Consent: Verbal consent obtained. Risks and benefits: risks, benefits and alternatives were discussed Type: abscess  Body area: Right thigh  Anesthesia: local infiltration  Incision was made with a scalpel.  Local anesthetic: lidocaine 1 % with Neut   Anesthetic total: 3 ml  Complexity: Simple Blunt dissection to break up loculations  Drainage: purulent  Drainage amount: Mild  to moderate   Packing material: 1/4 in iodoform gauze  Patient tolerance: Patient tolerated the procedure well with no immediate complications.    Benjiman Core, MD 12/21/14 1450

## 2014-12-21 NOTE — ED Notes (Signed)
Suture cart and other at bedside for md.

## 2014-12-21 NOTE — Discharge Instructions (Signed)
Abscess An abscess is an infected area that contains a collection of pus and debris.It can occur in almost any part of the body. An abscess is also known as a furuncle or boil. CAUSES  An abscess occurs when tissue gets infected. This can occur from blockage of oil or sweat glands, infection of hair follicles, or a minor injury to the skin. As the body tries to fight the infection, pus collects in the area and creates pressure under the skin. This pressure causes pain. People with weakened immune systems have difficulty fighting infections and get certain abscesses more often.  SYMPTOMS Usually an abscess develops on the skin and becomes a painful mass that is red, warm, and tender. If the abscess forms under the skin, you may feel a moveable soft area under the skin. Some abscesses break open (rupture) on their own, but most will continue to get worse without care. The infection can spread deeper into the body and eventually into the bloodstream, causing you to feel ill.  DIAGNOSIS  Your caregiver will take your medical history and perform a physical exam. A sample of fluid may also be taken from the abscess to determine what is causing your infection. TREATMENT  Your caregiver may prescribe antibiotic medicines to fight the infection. However, taking antibiotics alone usually does not cure an abscess. Your caregiver may need to make a small cut (incision) in the abscess to drain the pus. In some cases, gauze is packed into the abscess to reduce pain and to continue draining the area. HOME CARE INSTRUCTIONS   Only take over-the-counter or prescription medicines for pain, discomfort, or fever as directed by your caregiver.  If you were prescribed antibiotics, take them as directed. Finish them even if you start to feel better.  If gauze is used, follow your caregiver's directions for changing the gauze.  To avoid spreading the infection:  Keep your draining abscess covered with a  bandage.  Wash your hands well.  Do not share personal care items, towels, or whirlpools with others.  Avoid skin contact with others.  Keep your skin and clothes clean around the abscess.  Keep all follow-up appointments as directed by your caregiver. SEEK MEDICAL CARE IF:   You have increased pain, swelling, redness, fluid drainage, or bleeding.  You have muscle aches, chills, or a general ill feeling.  You have a fever. MAKE SURE YOU:   Understand these instructions.  Will watch your condition.  Will get help right away if you are not doing well or get worse. Document Released: 03/22/2005 Document Revised: 12/12/2011 Document Reviewed: 08/25/2011 [facility] Patient Information 2015 [place], [place]. This information is not intended to replace advice given to you by your health care provider. Make sure you discuss any questions you have with your health care provider.  Asthma Asthma is a recurring condition in which the airways tighten and narrow. Asthma can make it difficult to breathe. It can cause coughing, wheezing, and shortness of breath. Asthma episodes, also called asthma attacks, range from minor to life-threatening. Asthma cannot be cured, but medicines and lifestyle changes can help control it. CAUSES Asthma is believed to be caused by inherited (genetic) and environmental factors, but its exact cause is unknown. Asthma may be triggered by allergens, lung infections, or irritants in the air. Asthma triggers are different for each person. Common triggers include:   Animal dander.  Dust mites.  Cockroaches.  Pollen from trees or grass.  Mold.  Smoke.  Air pollutants such as  dust, household cleaners, hair sprays, aerosol sprays, paint fumes, strong chemicals, or strong odors.  Cold air, weather changes, and winds (which increase molds and pollens in the air).  Strong emotional expressions such as crying or laughing hard.  Stress.  Certain medicines (such as  aspirin) or types of drugs (such as beta-blockers).  Sulfites in foods and drinks. Foods and drinks that may contain sulfites include dried fruit, potato chips, and sparkling grape juice.  Infections or inflammatory conditions such as the flu, a cold, or an inflammation of the nasal membranes (rhinitis).  Gastroesophageal reflux disease (GERD).  Exercise or strenuous activity. SYMPTOMS Symptoms may occur immediately after asthma is triggered or many hours later. Symptoms include:  Wheezing.  Excessive nighttime or early morning coughing.  Frequent or severe coughing with a common cold.  Chest tightness.  Shortness of breath. DIAGNOSIS  The diagnosis of asthma is made by a review of your medical history and a physical exam. Tests may also be performed. These may include:  Lung function studies. These tests show how much air you breathe in and out.  Allergy tests.  Imaging tests such as X-rays. TREATMENT  Asthma cannot be cured, but it can usually be controlled. Treatment involves identifying and avoiding your asthma triggers. It also involves medicines. There are 2 classes of medicine used for asthma treatment:   Controller medicines. These prevent asthma symptoms from occurring. They are usually taken every day.  Reliever or rescue medicines. These quickly relieve asthma symptoms. They are used as needed and provide short-term relief. Your health care provider will help you create an asthma action plan. An asthma action plan is a written plan for managing and treating your asthma attacks. It includes a list of your asthma triggers and how they may be avoided. It also includes information on when medicines should be taken and when their dosage should be changed. An action plan may also involve the use of a device called a peak flow meter. A peak flow meter measures how well the lungs are working. It helps you monitor your condition. HOME CARE INSTRUCTIONS   Take medicines only as  directed by your health care provider. Speak with your health care provider if you have questions about how or when to take the medicines.  Use a peak flow meter as directed by your health care provider. Record and keep track of readings.  Understand and use the action plan to help minimize or stop an asthma attack without needing to seek medical care.  Control your home environment in the following ways to help prevent asthma attacks:  Do not smoke. Avoid being exposed to secondhand smoke.  Change your heating and air conditioning filter regularly.  Limit your use of fireplaces and wood stoves.  Get rid of pests (such as roaches and mice) and their droppings.  Throw away plants if you see mold on them.  Clean your floors and dust regularly. Use unscented cleaning products.  Try to have someone else vacuum for you regularly. Stay out of rooms while they are being vacuumed and for a short while afterward. If you vacuum, use a dust mask from a hardware store, a double-layered or microfilter vacuum cleaner bag, or a vacuum cleaner with a HEPA filter.  Replace carpet with wood, tile, or vinyl flooring. Carpet can trap dander and dust.  Use allergy-proof pillows, mattress covers, and box spring covers.  Wash bed sheets and blankets every week in hot water and dry them in a dryer.  Use blankets that are made of polyester or cotton.  Clean bathrooms and kitchens with bleach. If possible, have someone repaint the walls in these rooms with mold-resistant paint. Keep out of the rooms that are being cleaned and painted.  Wash hands frequently. SEEK MEDICAL CARE IF:   You have wheezing, shortness of breath, or a cough even if taking medicine to prevent attacks.  The colored mucus you cough up (sputum) is thicker than usual.  Your sputum changes from clear or white to yellow, green, gray, or bloody.  You have any problems that may be related to the medicines you are taking (such as a  rash, itching, swelling, or trouble breathing).  You are using a reliever medicine more than 2-3 times per week.  Your peak flow is still at 50-79% of your personal best after following your action plan for 1 hour.  You have a fever. SEEK IMMEDIATE MEDICAL CARE IF:   You seem to be getting worse and are unresponsive to treatment during an asthma attack.  You are short of breath even at rest.  You get short of breath when doing very little physical activity.  You have difficulty eating, drinking, or talking due to asthma symptoms.  You develop chest pain.  You develop a fast heartbeat.  You have a bluish color to your lips or fingernails.  You are light-headed, dizzy, or faint.  Your peak flow is less than 50% of your personal best. MAKE SURE YOU:   Understand these instructions.  Will watch your condition.  Will get help right away if you are not doing well or get worse. Document Released: 06/12/2005 Document Revised: 10/27/2013 Document Reviewed: 01/09/2013 [facility] Patient Information 2015 [place]. This information is not intended to replace advice given to you by your health care provider. Make sure you discuss any questions you have with your health care provider.

## 2014-12-21 NOTE — ED Notes (Signed)
Patient was given gauze and tape per Rubin PayorPickering, MD request.

## 2014-12-21 NOTE — ED Notes (Signed)
Pt here for abscess to right inner though. sts also asthma is flared up and is running low on medication.

## 2014-12-30 ENCOUNTER — Ambulatory Visit: Payer: Self-pay | Attending: Internal Medicine | Admitting: Internal Medicine

## 2014-12-30 ENCOUNTER — Encounter: Payer: Self-pay | Admitting: Internal Medicine

## 2014-12-30 VITALS — BP 145/90 | HR 90 | Temp 98.2°F | Resp 18 | Ht 69.0 in | Wt 180.0 lb

## 2014-12-30 DIAGNOSIS — J449 Chronic obstructive pulmonary disease, unspecified: Secondary | ICD-10-CM

## 2014-12-30 DIAGNOSIS — Z720 Tobacco use: Secondary | ICD-10-CM

## 2014-12-30 DIAGNOSIS — L02415 Cutaneous abscess of right lower limb: Secondary | ICD-10-CM

## 2014-12-30 DIAGNOSIS — F17200 Nicotine dependence, unspecified, uncomplicated: Secondary | ICD-10-CM

## 2014-12-30 MED ORDER — CEPHALEXIN 500 MG PO CAPS: 500.0000 mg | ORAL_CAPSULE | Freq: Four times a day (QID) | ORAL | Status: DC

## 2014-12-30 MED ORDER — VARENICLINE TARTRATE 0.5 MG X 11 & 1 MG X 42 PO MISC: ORAL | Status: DC

## 2014-12-30 NOTE — Progress Notes (Signed)
MRN: [id number] Name: Joshua Powell  Sex: male Age: 59 y.o. DOB: 05/27/1956  Allergies: Review of patient's allergies indicates no known allergies.  Chief Complaint  Patient presents with  . Hospitalization Follow-up    HPI: Patient is 59 y.o. male who has history of hypertension, COPD/asthma, recently went to the emergency room and was treated for asthma exacerbation also had abscess in the right thigh which was drained and he was started on antibiotic, patient already finished antibiotics he also noted one  follicular lesion on his abdomen which was slightly draining and now better denies any fever chills, currently denies any chest pain shortness of breath.  Past Medical History  Diagnosis Date  . Asthma   . Hypertension   . COPD (chronic obstructive pulmonary disease)   . Allergy     Past Surgical History  Procedure Laterality Date  . No past surgeries        Medication List       This list is accurate as of: 12/30/14  5:41 PM.  Always use your most recent med list.               albuterol 108 (90 BASE) MCG/ACT inhaler  Commonly known as:  PROVENTIL HFA;VENTOLIN HFA  Inhale 2 puffs into the lungs every 6 (six) hours as needed for wheezing.     aspirin 81 MG tablet  Take 1 tablet (81 mg total) by mouth daily.     budesonide-formoterol 160-4.5 MCG/ACT inhaler  Commonly known as:  SYMBICORT  Inhale 2 puffs into the lungs 2 (two) times daily.     calcium carbonate 1250 (500 CA) MG tablet  Commonly known as:  OS-CAL - dosed in mg of elemental calcium  Take 1 tablet by mouth daily with breakfast.     cephALEXin 500 MG capsule  Commonly known as:  KEFLEX  Take 1 capsule (500 mg total) by mouth 4 (four) times daily.     cyanocobalamin 500 MCG tablet  Take 500 mcg by mouth daily.     vitamin B-12 100 MCG tablet  Commonly known as:  CYANOCOBALAMIN  Take 50 mcg by mouth daily.     fish oil-omega-3 fatty acids 1000 MG capsule  Take 1 g by mouth daily.     ibuprofen 600 MG tablet  Commonly known as:  ADVIL,MOTRIN  Take 1 tablet (600 mg total) by mouth every 8 (eight) hours as needed.     ipratropium-albuterol 0.5-2.5 (3) MG/3ML Soln  Commonly known as:  DUONEB  Take 3 mLs by nebulization every 6 (six) hours as needed (shortness of breath).     loratadine 10 MG tablet  Commonly known as:  CLARITIN  Take 10 mg by mouth daily.     losartan-hydrochlorothiazide 100-12.5 MG per tablet  Commonly known as:  HYZAAR  Take 1 tablet by mouth daily.     Melatonin 10 MG Tabs  Take 1 tablet by mouth at bedtime.     MOVIPREP 100 G Solr  Generic drug:  peg 3350 powder  Take as directed     multivitamin tablet  Take 1 tablet by mouth daily.     predniSONE 20 MG tablet  Commonly known as:  DELTASONE  Take 1 tablet (20 mg total) by mouth daily.     sulfamethoxazole-trimethoprim 800-160 MG per tablet  Commonly known as:  BACTRIM DS,SEPTRA DS  Take 1 tablet by mouth 3 (three) times daily.     tiotropium 18 MCG inhalation capsule  Commonly  known as:  SPIRIVA  Place 1 capsule (18 mcg total) into inhaler and inhale daily.     traZODone 100 MG tablet  Commonly known as:  DESYREL  Take 1 tablet (100 mg total) by mouth at bedtime as needed. for sleep     trimethoprim-polymyxin b ophthalmic solution  Commonly known as:  POLYTRIM  Place 1 drop into the left eye every 4 (four) hours.     varenicline 0.5 MG X 11 & 1 MG X 42 tablet  Commonly known as:  CHANTIX STARTING MONTH PAK  Take one 0.5 mg tablet by mouth once daily for 3 days, then increase to one 0.5 mg tablet twice daily for 4 days, then increase to one 1 mg tablet twice daily.     vitamin E 400 UNIT capsule  Take 400 Units by mouth daily.        Meds ordered this encounter  Medications  . cephALEXin (KEFLEX) 500 MG capsule    Sig: Take 1 capsule (500 mg total) by mouth 4 (four) times daily.    Dispense:  16 capsule    Refill:  0  . varenicline (CHANTIX STARTING MONTH PAK) 0.5 MG  X 11 & 1 MG X 42 tablet    Sig: Take one 0.5 mg tablet by mouth once daily for 3 days, then increase to one 0.5 mg tablet twice daily for 4 days, then increase to one 1 mg tablet twice daily.    Dispense:  53 tablet    Refill:  0    Immunization History  Administered Date(s) Administered  . Influenza,inj,Quad PF,36+ Mos 04/24/2014  . Pneumococcal Polysaccharide-23 04/24/2014    Family History  Problem Relation Age of Onset  . Hypertension Other   . Heart disease Father   . Colon polyps Father 29    "tumor removed" had colostomy, NOT cancer per son  . Colon cancer Neg Hx     History  Substance Use Topics  . Smoking status: Current Every Day Smoker -- 0.25 packs/day    Types: Cigarettes  . Smokeless tobacco: Current User     Comment: uses electronic cigarettes PRN  . Alcohol Use: No    Review of Systems   As noted in HPI  Filed Vitals:   12/30/14 1606  BP: 145/90  Pulse: 90  Temp: 98.2 F (36.8 C)  Resp: 18    Physical Exam  Physical Exam  Constitutional: No distress.  Eyes: EOM are normal.  Cardiovascular: Normal rate and regular rhythm.   Pulmonary/Chest: Breath sounds normal. No respiratory distress. He has no wheezes. He has no rales.  Skin:  Small incision site at medial thigh looks clean minimal surrounding erythema no exudate non tender to touch.    CBC    Component Value Date/Time   WBC 5.4 09/12/2013 1736   WBC 3.9* 03/01/2012 1105   RBC 4.24 09/12/2013 1736   RBC 4.54* 03/01/2012 1105   HGB 14.5 09/12/2013 1736   HGB 14.9 03/01/2012 1105   HCT 40.5 09/12/2013 1736   HCT 47.1 03/01/2012 1105   PLT 245 09/12/2013 1736   MCV 95.5 09/12/2013 1736   MCV 103.7* 03/01/2012 1105   LYMPHSABS 2.2 09/12/2013 1736   MONOABS 0.3 09/12/2013 1736   EOSABS 0.3 09/12/2013 1736   BASOSABS 0.1 09/12/2013 1736    CMP     Component Value Date/Time   NA 138 11/13/2014 1612   K 4.5 11/13/2014 1612   CL 98 11/13/2014 1612   CO2 30  11/13/2014 1612    GLUCOSE 39* 11/13/2014 1612   BUN 10 11/13/2014 1612   CREATININE 1.33 11/13/2014 1612   CREATININE 1.15 10/01/2012 1548   CALCIUM 9.7 11/13/2014 1612   PROT 7.1 11/13/2014 1612   ALBUMIN 4.4 11/13/2014 1612   AST 24 11/13/2014 1612   ALT 19 11/13/2014 1612   ALKPHOS 73 11/13/2014 1612   BILITOT 0.7 11/13/2014 1612   GFRNONAA 59* 11/13/2014 1612   GFRNONAA 70* 10/01/2012 1548   GFRAA 68 11/13/2014 1612   GFRAA 81* 10/01/2012 1548    Lab Results  Component Value Date/Time   CHOL 204* 09/12/2013 05:36 PM    Lab Results  Component Value Date/Time   HGBA1C 4.8 10/01/2012 03:48 PM    Lab Results  Component Value Date/Time   AST 24 11/13/2014 04:12 PM    Assessment and Plan  COPD mixed type Patient to continue with Symbicort, albuterol when necessary  Abscess of right thigh - Plan: status post I&D , have given patient from orders of antibiotic since he had follicular lesion on his abdomen cephALEXin (KEFLEX) 500 MG capsule  Smoking - Plan: varenicline (CHANTIX STARTING MONTH PAK) 0.5 MG X 11 & 1 MG X 42 tablet   Return in about 3 months (around 04/01/2015), or if symptoms worsen or fail to improve.   This note has been created with Education officer, environmental. Any transcriptional errors are unintentional.    Doris Cheadle, MD

## 2014-12-30 NOTE — Patient Instructions (Signed)
DASH Eating Plan °DASH stands for "Dietary Approaches to Stop Hypertension." The DASH eating plan is a healthy eating plan that has been shown to reduce high blood pressure (hypertension). Additional health benefits may include reducing the risk of type 2 diabetes mellitus, heart disease, and stroke. The DASH eating plan may also help with weight loss. °WHAT DO I NEED TO KNOW ABOUT THE DASH EATING PLAN? °For the DASH eating plan, you will follow these general guidelines: °· Choose foods with a percent daily value for sodium of less than 5% (as listed on the food label). °· Use salt-free seasonings or herbs instead of table salt or sea salt. °· Check with your health care provider or pharmacist before using salt substitutes. °· Eat lower-sodium products, often labeled as "lower sodium" or "no salt added." °· Eat fresh foods. °· Eat more vegetables, fruits, and low-fat dairy products. °· Choose whole grains. Look for the word "whole" as the first word in the ingredient list. °· Choose fish and skinless chicken or turkey more often than red meat. Limit fish, poultry, and meat to 6 oz (170 g) each day. °· Limit sweets, desserts, sugars, and sugary drinks. °· Choose heart-healthy fats. °· Limit cheese to 1 oz (28 g) per day. °· Eat more home-cooked food and less restaurant, buffet, and fast food. °· Limit fried foods. °· Cook foods using methods other than frying. °· Limit canned vegetables. If you do use them, rinse them well to decrease the sodium. °· When eating at a restaurant, ask that your food be prepared with less salt, or no salt if possible. °WHAT FOODS CAN I EAT? °Seek help from a dietitian for individual calorie needs. °Grains °Whole grain or whole wheat bread. Brown rice. Whole grain or whole wheat pasta. Quinoa, bulgur, and whole grain cereals. Low-sodium cereals. Corn or whole wheat flour tortillas. Whole grain cornbread. Whole grain crackers. Low-sodium crackers. °Vegetables °Fresh or frozen vegetables  (raw, steamed, roasted, or grilled). Low-sodium or reduced-sodium tomato and vegetable juices. Low-sodium or reduced-sodium tomato sauce and paste. Low-sodium or reduced-sodium canned vegetables.  °Fruits °All fresh, canned (in natural juice), or frozen fruits. °Meat and Other Protein Products °Ground beef (85% or leaner), grass-fed beef, or beef trimmed of fat. Skinless chicken or turkey. Ground chicken or turkey. Pork trimmed of fat. All fish and seafood. Eggs. Dried beans, peas, or lentils. Unsalted nuts and seeds. Unsalted canned beans. °Dairy °Low-fat dairy products, such as skim or 1% milk, 2% or reduced-fat cheeses, low-fat ricotta or cottage cheese, or plain low-fat yogurt. Low-sodium or reduced-sodium cheeses. °Fats and Oils °Tub margarines without trans fats. Light or reduced-fat mayonnaise and salad dressings (reduced sodium). Avocado. Safflower, olive, or canola oils. Natural peanut or almond butter. °Other °Unsalted popcorn and pretzels. °The items listed above may not be a complete list of recommended foods or beverages. Contact your dietitian for more options. °WHAT FOODS ARE NOT RECOMMENDED? °Grains °White bread. White pasta. White rice. Refined cornbread. Bagels and croissants. Crackers that contain trans fat. °Vegetables °Creamed or fried vegetables. Vegetables in a cheese sauce. Regular canned vegetables. Regular canned tomato sauce and paste. Regular tomato and vegetable juices. °Fruits °Dried fruits. Canned fruit in light or heavy syrup. Fruit juice. °Meat and Other Protein Products °Fatty cuts of meat. Ribs, chicken wings, bacon, sausage, bologna, salami, chitterlings, fatback, hot dogs, bratwurst, and packaged luncheon meats. Salted nuts and seeds. Canned beans with salt. °Dairy °Whole or 2% milk, cream, half-and-half, and cream cheese. Whole-fat or sweetened yogurt. Full-fat   cheeses or blue cheese. Nondairy creamers and whipped toppings. Processed cheese, cheese spreads, or cheese  curds. °Condiments °Onion and garlic salt, seasoned salt, table salt, and sea salt. Canned and packaged gravies. Worcestershire sauce. Tartar sauce. Barbecue sauce. Teriyaki sauce. Soy sauce, including reduced sodium. Steak sauce. Fish sauce. Oyster sauce. Cocktail sauce. Horseradish. Ketchup and mustard. Meat flavorings and tenderizers. Bouillon cubes. Hot sauce. Tabasco sauce. Marinades. Taco seasonings. Relishes. °Fats and Oils °Butter, stick margarine, lard, shortening, ghee, and bacon fat. Coconut, palm kernel, or palm oils. Regular salad dressings. °Other °Pickles and olives. Salted popcorn and pretzels. °The items listed above may not be a complete list of foods and beverages to avoid. Contact your dietitian for more information. °WHERE CAN I FIND MORE INFORMATION? °[employer], [employer], [employer]: www.nhlbi.nih.gov/health/health-topics/topics/dash/ °Document Released: 06/01/2011 Document Revised: 10/27/2013 Document Reviewed: 04/16/2013 °Exit[employer]® Patient Information ©2015 [employer]. This information is not intended to replace advice given to you by your health care provider. Make sure you discuss any questions you have with your health care provider. ° °

## 2014-12-30 NOTE — Progress Notes (Signed)
Patient here for ED f/u for abscess located on inside right thigh. Patient reports it is not completely gone, but it is trying to close up now.  Patient reports a abscess present on left side of abdomen. Patient reports it has occurred before in the same place. Patient has been using OTC ointment on it. ED prescribed him antibiotics. Patient denies any pain today. Patient reports he has experienced lightheadedness the last two days.   Patient has taken medication today. Patient was prescribed prednisone but has not been able to pick it up due to not being able to afford it.

## 2015-01-19 ENCOUNTER — Other Ambulatory Visit: Payer: Self-pay

## 2015-01-19 DIAGNOSIS — F17200 Nicotine dependence, unspecified, uncomplicated: Secondary | ICD-10-CM

## 2015-01-19 MED ORDER — VARENICLINE TARTRATE 1 MG PO TABS
1.0000 mg | ORAL_TABLET | Freq: Two times a day (BID) | ORAL | Status: DC
Start: 2015-01-19 — End: 2015-05-18

## 2015-01-19 MED ORDER — VARENICLINE TARTRATE 0.5 MG X 11 & 1 MG X 42 PO MISC: ORAL | Status: DC

## 2015-02-05 ENCOUNTER — Ambulatory Visit: Payer: Self-pay | Attending: Internal Medicine

## 2015-02-19 ENCOUNTER — Ambulatory Visit: Payer: Self-pay | Admitting: Family Medicine

## 2015-03-21 IMAGING — CR DG WRIST COMPLETE 3+V*R*
4 series · 4 of 4 positions shown · non-contrast
Comparison: None.

CLINICAL DATA: Right wrist pain for 4 months.  No known injury.

EXAM:
RIGHT WRIST - COMPLETE 3+ VIEW

[x wrist pa right]
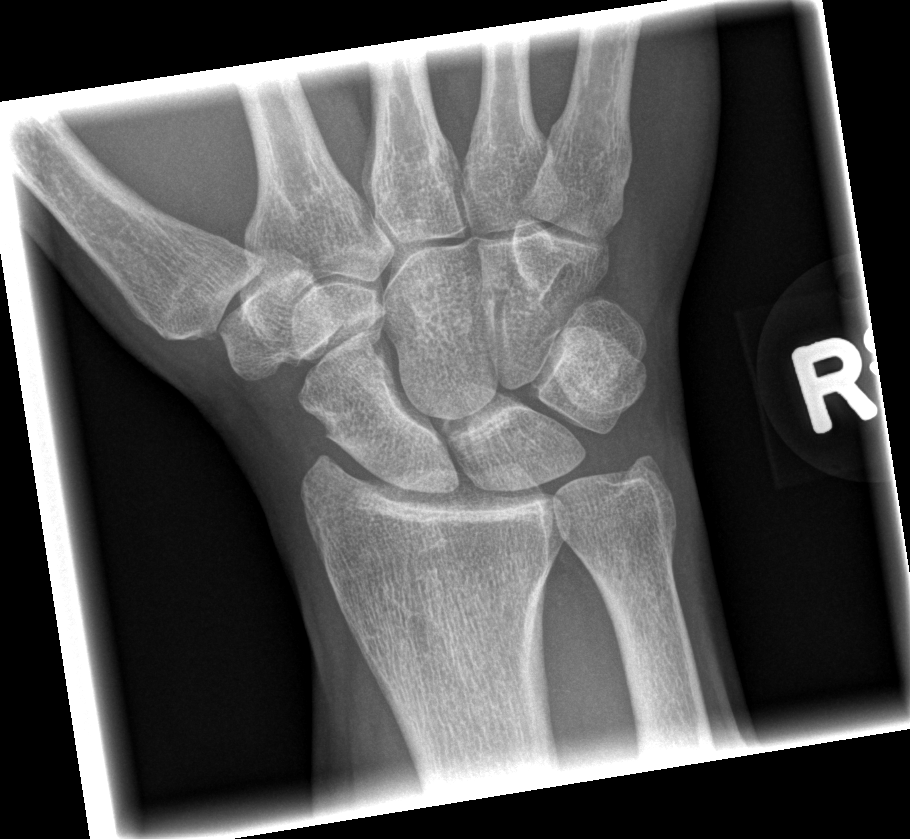

[x wrist obl right]
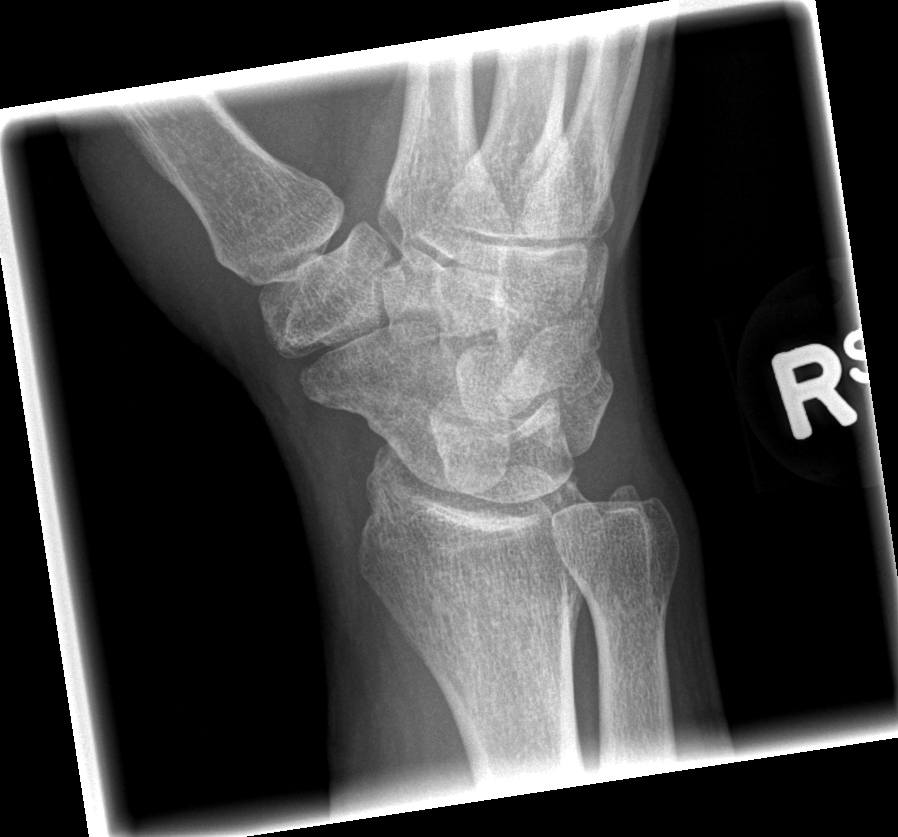

[x wrist lat right]
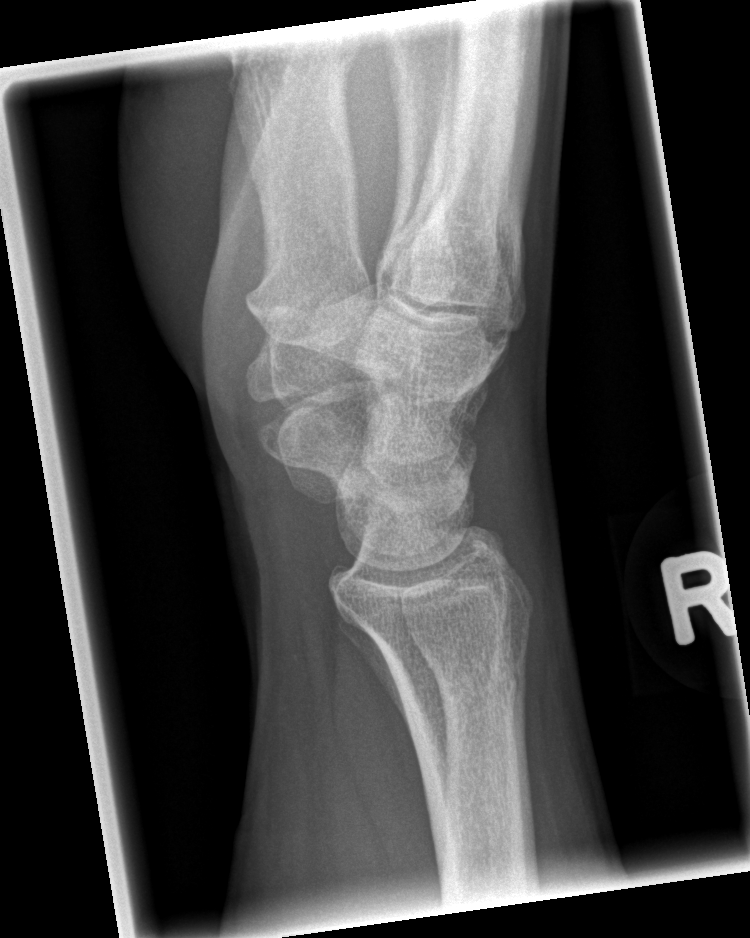

[x navicular]
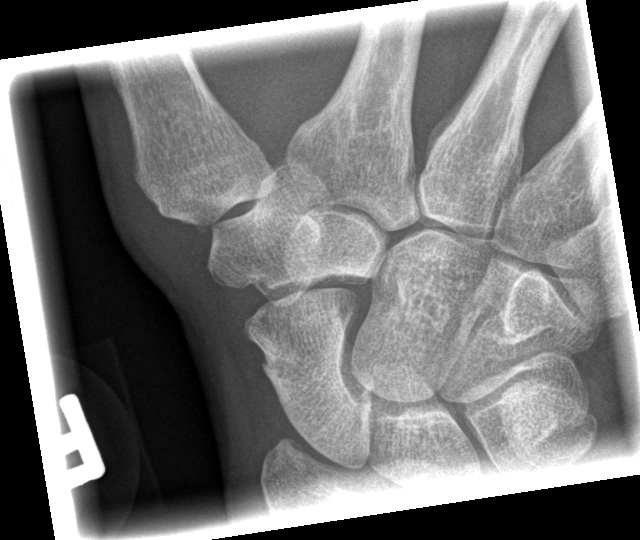

[4 of 4 positions shown; findings below may reference images not displayed]

FINDINGS: There is no evidence of fracture or dislocation. There is no
evidence of arthropathy or other focal bone abnormality. Soft
tissues are unremarkable.
IMPRESSION: Negative.

## 2015-05-13 ENCOUNTER — Other Ambulatory Visit: Payer: Self-pay | Admitting: Internal Medicine

## 2015-05-13 NOTE — Telephone Encounter (Signed)
Nurse called patient, patient verified date of birth. Patient aware of 1 month supply of losartan-hctz being sent to pharmacy. Patient agrees to make appointment to be seen when he picks up medication tomorrow.

## 2015-05-18 ENCOUNTER — Encounter: Payer: Self-pay | Admitting: Family Medicine

## 2015-05-18 ENCOUNTER — Ambulatory Visit: Payer: Self-pay | Attending: Family Medicine | Admitting: Family Medicine

## 2015-05-18 VITALS — BP 115/66 | HR 84 | Temp 98.6°F | Resp 16 | Ht 69.0 in | Wt 173.0 lb

## 2015-05-18 DIAGNOSIS — N529 Male erectile dysfunction, unspecified: Secondary | ICD-10-CM

## 2015-05-18 DIAGNOSIS — Z0000 Encounter for general adult medical examination without abnormal findings: Secondary | ICD-10-CM

## 2015-05-18 DIAGNOSIS — I10 Essential (primary) hypertension: Secondary | ICD-10-CM

## 2015-05-18 DIAGNOSIS — G4700 Insomnia, unspecified: Secondary | ICD-10-CM

## 2015-05-18 DIAGNOSIS — J449 Chronic obstructive pulmonary disease, unspecified: Secondary | ICD-10-CM

## 2015-05-18 MED ORDER — LOSARTAN POTASSIUM-HCTZ 100-12.5 MG PO TABS: 1.0000 | ORAL_TABLET | Freq: Every day | ORAL | Status: AC

## 2015-05-18 MED ORDER — TRAZODONE HCL 100 MG PO TABS: 100.0000 mg | ORAL_TABLET | Freq: Every evening | ORAL | Status: AC | PRN

## 2015-05-18 MED ORDER — CALCIUM CARBONATE-VITAMIN D 500-200 MG-UNIT PO TABS: 2.0000 | ORAL_TABLET | Freq: Every day | ORAL | Status: AC

## 2015-05-18 NOTE — Patient Instructions (Signed)
Joshua Powell was seen today for hypertension.  Diagnoses and all orders for this visit:  Insomnia -     traZODone (DESYREL) 100 MG tablet; Take 1 tablet (100 mg total) by mouth at bedtime as needed. for sleep  Essential hypertension -     losartan-hydrochlorothiazide (HYZAAR) 100-12.5 MG tablet; Take 1 tablet by mouth daily.  Erectile dysfunction, unspecified erectile dysfunction type -     Testosterone; Future  Chronic obstructive pulmonary disease, unspecified COPD type (HCC) -     calcium-vitamin D (OSCAL 500/200 D-3) 500-200 MG-UNIT tablet; Take 2 tablets by mouth daily with breakfast.   Schedule fasting lab for testosterone check F/u in 6 months for HTN  Dr. Armen PickupFunches

## 2015-05-18 NOTE — Assessment & Plan Note (Signed)
A: HTN, well controlled Med: compliant P: Continue current regimen 

## 2015-05-18 NOTE — Assessment & Plan Note (Signed)
Fasting testosterone ordered

## 2015-05-18 NOTE — Assessment & Plan Note (Signed)
Refilled trazodone.

## 2015-05-18 NOTE — Progress Notes (Signed)
F/U HTN  Medicine refills  Tobacco user 3 cigarette per month No pain today  No suicide thought in the past two weeks

## 2015-05-18 NOTE — Progress Notes (Signed)
Subjective:  Patient ID: Joshua Powell, male    DOB: March 31, 1956  Age: 59 y.o. MRN: [id number]  CC: Hypertension   HPI Joshua Powell presents for   1. CHRONIC HYPERTENSION  Disease Monitoring  Blood pressure range: not checking   Chest pain: no   Dyspnea: no   Claudication: no   Medication compliance: yes  Medication Side Effects  Lightheadedness: yes   Urinary frequency: no   Edema: no   Impotence: yes   Preventitive Healthcare:  Exercise: yes   Diet Pattern: regular meals   Salt Restriction: no   2. Insomnia: requesting trazodone refill. Trazodone helps with insomnia that is induced by Symbicort use.   3. HM: amenable to flu shot today. Amenable to colonoscopy but does not readily have someone who can drop him off, stay, and take him home.   Social History  Substance Use Topics  . Smoking status: Current Every Day Smoker -- 0.25 packs/day    Types: Cigarettes  . Smokeless tobacco: Current User     Comment: uses electronic cigarettes PRN  . Alcohol Use: No    Outpatient Prescriptions Prior to Visit  Medication Sig Dispense Refill  . albuterol (PROVENTIL HFA;VENTOLIN HFA) 108 (90 BASE) MCG/ACT inhaler Inhale 2 puffs into the lungs every 6 (six) hours as needed for wheezing. 3 Inhaler 3  . aspirin 81 MG tablet Take 1 tablet (81 mg total) by mouth daily. 30 tablet 2  . budesonide-formoterol (SYMBICORT) 160-4.5 MCG/ACT inhaler Inhale 2 puffs into the lungs 2 (two) times daily. 3 Inhaler 3  . calcium carbonate (OS-CAL - DOSED IN MG OF ELEMENTAL CALCIUM) 1250 MG tablet Take 1 tablet by mouth daily with breakfast.    . cephALEXin (KEFLEX) 500 MG capsule Take 1 capsule (500 mg total) by mouth 4 (four) times daily. 16 capsule 0  . cyanocobalamin 500 MCG tablet Take 500 mcg by mouth daily.    . fish oil-omega-3 fatty acids 1000 MG capsule Take 1 g by mouth daily.    Marland Kitchen ibuprofen (ADVIL,MOTRIN) 600 MG tablet Take 1 tablet (600 mg total) by mouth every 8 (eight) hours as needed.  30 tablet 1  . ipratropium-albuterol (DUONEB) 0.5-2.5 (3) MG/3ML SOLN Take 3 mLs by nebulization every 6 (six) hours as needed (shortness of breath). 360 mL 3  . loratadine (CLARITIN) 10 MG tablet Take 10 mg by mouth daily.    Marland Kitchen losartan-hydrochlorothiazide (HYZAAR) 100-12.5 MG tablet TAKE 1 TABLET BY MOUTH DAILY. 30 tablet 0  . Melatonin 10 MG TABS Take 1 tablet by mouth at bedtime.    Marland Kitchen MOVIPREP 100 G SOLR Take as directed (Patient not taking: Reported on 12/30/2014) 1 kit 0  . Multiple Vitamin (MULTIVITAMIN) tablet Take 1 tablet by mouth daily.    . predniSONE (DELTASONE) 20 MG tablet Take 1 tablet (20 mg total) by mouth daily. (Patient not taking: Reported on 12/30/2014) 3 tablet 0  . sulfamethoxazole-trimethoprim (BACTRIM DS,SEPTRA DS) 800-160 MG per tablet Take 1 tablet by mouth 3 (three) times daily. 12 tablet 0  . tiotropium (SPIRIVA) 18 MCG inhalation capsule Place 1 capsule (18 mcg total) into inhaler and inhale daily. 90 capsule 3  . traZODone (DESYREL) 100 MG tablet Take 1 tablet (100 mg total) by mouth at bedtime as needed. for sleep 30 tablet 3  . trimethoprim-polymyxin b (POLYTRIM) ophthalmic solution Place 1 drop into the left eye every 4 (four) hours. 10 mL 0  . varenicline (CHANTIX STARTING MONTH PAK) 0.5 MG X 11 & 1  MG X 42 tablet Take one 0.5 mg tablet by mouth once daily for 3 days, then increase to one 0.5 mg tablet twice daily for 4 days, then increase to one 1 mg tablet twice daily. 53 tablet 0  . varenicline (CHANTIX) 1 MG tablet Take 1 tablet (1 mg total) by mouth 2 (two) times daily. 180 tablet 3  . vitamin B-12 (CYANOCOBALAMIN) 100 MCG tablet Take 50 mcg by mouth daily.    . vitamin E 400 UNIT capsule Take 400 Units by mouth daily.     No facility-administered medications prior to visit.    ROS Review of Systems  Constitutional: Negative for fever, chills, fatigue and unexpected weight change.  Eyes: Negative for visual disturbance.  Respiratory: Negative for cough and  shortness of breath.   Cardiovascular: Negative for chest pain, palpitations and leg swelling.  Gastrointestinal: Negative for nausea, vomiting, abdominal pain, diarrhea, constipation and blood in stool.  Endocrine: Negative for polydipsia, polyphagia and polyuria.  Musculoskeletal: Positive for arthralgias. Negative for myalgias, back pain, gait problem and neck pain.  Skin: Negative for rash.  Allergic/Immunologic: Negative for immunocompromised state.  Neurological: Positive for light-headedness.  Hematological: Negative for adenopathy. Does not bruise/bleed easily.  Psychiatric/Behavioral: Positive for sleep disturbance. Negative for suicidal ideas and dysphoric mood. The patient is not nervous/anxious.     Objective:  BP 115/66 mmHg  Pulse 84  Temp(Src) 98.6 F (37 C) (Oral)  Resp 16  Ht $R'5\' 9"'zD$  (1.753 m)  Wt 173 lb (78.472 kg)  BMI 25.54 kg/m2  SpO2 98%  BP/Weight 05/18/2015 12/30/2014 6/38/9373  Systolic BP 428 768 115  Diastolic BP 66 90 89  Wt. (Lbs) 173 180 180  BMI 25.54 26.57 27.38   Physical Exam  Constitutional: He appears well-developed and well-nourished. No distress.  HENT:  Head: Normocephalic and atraumatic.  Neck: Normal range of motion. Neck supple.  Cardiovascular: Normal rate, regular rhythm, normal heart sounds and intact distal pulses.   Pulmonary/Chest: Effort normal and breath sounds normal.  Musculoskeletal: He exhibits no edema.  Neurological: He is alert.  Skin: Skin is warm and dry. No rash noted. No erythema.  Psychiatric: He has a normal mood and affect.    Assessment & Plan:   Problem List Items Addressed This Visit    COPD (chronic obstructive pulmonary disease) (HCC) (Chronic)   Relevant Medications   calcium-vitamin D (OSCAL 500/200 D-3) 500-200 MG-UNIT tablet   Erectile dysfunction (Chronic)    Fasting testosterone ordered       Relevant Orders   Testosterone   Hypertension (Chronic)    A: HTN, well controlled Med:  compliant P:  Continue current regimen       Relevant Medications   losartan-hydrochlorothiazide (HYZAAR) 100-12.5 MG tablet   Insomnia - Primary (Chronic)    Refilled trazodone       Relevant Medications   traZODone (DESYREL) 100 MG tablet    Other Visit Diagnoses    Healthcare maintenance        Relevant Orders    Flu Vaccine QUAD 36+ mos IM (Completed)       No orders of the defined types were placed in this encounter.    Follow-up: No Follow-up on file.   Boykin Nearing MD

## 2015-05-28 ENCOUNTER — Other Ambulatory Visit: Payer: Self-pay | Admitting: Internal Medicine

## 2015-06-09 ENCOUNTER — Other Ambulatory Visit: Payer: Self-pay | Admitting: Internal Medicine

## 2015-06-09 MED ORDER — TIOTROPIUM BROMIDE MONOHYDRATE 18 MCG IN CAPS: ORAL_CAPSULE | RESPIRATORY_TRACT | Status: AC

## 2015-06-10 ENCOUNTER — Other Ambulatory Visit: Payer: Self-pay | Admitting: *Deleted

## 2015-06-10 DIAGNOSIS — J449 Chronic obstructive pulmonary disease, unspecified: Secondary | ICD-10-CM

## 2015-06-10 MED ORDER — ALBUTEROL SULFATE HFA 108 (90 BASE) MCG/ACT IN AERS: 2.0000 | INHALATION_SPRAY | Freq: Four times a day (QID) | RESPIRATORY_TRACT | Status: DC | PRN

## 2015-06-15 ENCOUNTER — Other Ambulatory Visit: Payer: Self-pay | Admitting: *Deleted

## 2015-06-15 DIAGNOSIS — J449 Chronic obstructive pulmonary disease, unspecified: Secondary | ICD-10-CM

## 2015-06-15 MED ORDER — ALBUTEROL SULFATE HFA 108 (90 BASE) MCG/ACT IN AERS: 2.0000 | INHALATION_SPRAY | Freq: Four times a day (QID) | RESPIRATORY_TRACT | Status: DC | PRN

## 2015-07-07 MED FILL — LOSARTAN-HCTZ 100-12.5 MG T: 100-12.5 | 30 days supply | Qty: 30 | Fill #1

## 2015-07-12 ENCOUNTER — Telehealth: Payer: Self-pay | Admitting: Family Medicine

## 2015-07-12 NOTE — Telephone Encounter (Signed)
Pt. Is calling with questions in regards to his medication....please follow up with patient

## 2015-07-14 MED FILL — VENTOLIN HFA 90 MCG INHALER: 108 (90 BAS | 25 days supply | Qty: 18 | Fill #5

## 2015-07-14 MED FILL — $CHANTIX 1 MG TABLET: 1 | 28 days supply | Qty: 56 | Fill #1

## 2015-07-16 MED FILL — IPRAT-ALBUT 0.5-3(2.5) MG/3: 0.5-2.5 (3) | 30 days supply | Qty: 360 | Fill #1

## 2015-07-19 MED FILL — traZODone HCL 100 MG TABS: 100 | 30 days supply | Qty: 30 | Fill #1

## 2015-07-19 MED FILL — $Symbicort 160-4.5mcg/act: 160-4.5 | 30 days supply | Qty: 1 | Fill #6

## 2015-07-20 NOTE — Telephone Encounter (Signed)
Pt returned call  Stated going out of stated to help a family friend and requesting medication refills Pt already spoke with pharmacy and will pick up Rx today after 3:00

## 2015-07-20 NOTE — Telephone Encounter (Signed)
Patient called returning nurse's phone call °

## 2015-07-20 NOTE — Telephone Encounter (Signed)
LVM to return call.

## 2015-09-15 MED FILL — $VENTOLIN HFA 18G INHALER: 108 (90 BAS | 25 days supply | Qty: 18 | Fill #6

## 2015-09-15 MED FILL — $CHANTIX 1 MG TABLET: 1 | 28 days supply | Qty: 56 | Fill #2

## 2015-09-15 MED FILL — $Symbicort 160-4.5mcg/act: 160-4.5 | 30 days supply | Qty: 1 | Fill #7

## 2015-09-15 MED FILL — SPIRIVA 18 MCG CP-HANDIHALE: 18 | 30 days supply | Qty: 30 | Fill #2

## 2015-09-15 MED FILL — IPRAT-ALBUT 0.5-3(2.5) MG/3: 0.5-2.5 (3) | 30 days supply | Qty: 360 | Fill #2

## 2015-09-15 MED FILL — traZODone HCL 100 MG TABS: 100 | 30 days supply | Qty: 30 | Fill #2

## 2015-10-08 ENCOUNTER — Telehealth: Payer: Self-pay | Admitting: Family Medicine

## 2015-10-08 NOTE — Telephone Encounter (Signed)
Patient called after hours line Nurse called me. Patient requested refill of losartan-HCTZ to be called into to [facility] in [place].  He reported being in [place] temporarily.   He received some of his meds but did not receive losartan-HCTZ and had been out for 4 days.  He endorsed head pressure and blurred vision off and on.   Plan: Refill losartan-HCTZ, 30 with 0 refills. Patient advised to have BP checked and to seek immediate medical attention for SBP > 180 or DBP >110.  Nurse line nurse will call patient with instructions.

## 2015-10-11 MED FILL — LOSARTAN-HCTZ 100-12.5 MG T: 100-12.5 | 30 days supply | Qty: 30 | Fill #2

## 2015-12-08 ENCOUNTER — Other Ambulatory Visit: Payer: Self-pay | Admitting: Internal Medicine

## 2015-12-08 MED FILL — SPIRIVA 18 MCG CP-HANDIHALE: 18 | 30 days supply | Qty: 30 | Fill #3

## 2015-12-08 MED FILL — $CHANTIX 1 MG TABLET: 1 | 28 days supply | Qty: 56 | Fill #3

## 2015-12-08 MED FILL — traZODone HCL 100 MG TABS: 100 | 30 days supply | Qty: 30 | Fill #3

## 2015-12-08 MED FILL — LOSARTAN-HCTZ 100-12.5 MG T: 100-12.5 | 30 days supply | Qty: 30 | Fill #3

## 2015-12-09 ENCOUNTER — Other Ambulatory Visit: Payer: Self-pay | Admitting: Internal Medicine

## 2015-12-09 MED FILL — $Symbicort 160-4.5mcg/act: 160-4.5 | 30 days supply | Qty: 1 | Fill #0

## 2015-12-17 ENCOUNTER — Other Ambulatory Visit: Payer: Self-pay | Admitting: Internal Medicine

## 2016-02-15 ENCOUNTER — Other Ambulatory Visit: Payer: Self-pay | Admitting: Family Medicine

## 2016-02-15 MED FILL — SPIRIVA 18 MCG CP-HANDIHALE: 18 | 30 days supply | Qty: 30 | Fill #4

## 2016-02-15 MED FILL — SYMBICORT 160-4.5 MCG INH: 160-4.5 | 30 days supply | Qty: 10 | Fill #0

## 2016-02-15 MED FILL — $VENTOLIN HFA 18G INHALER: 108 (90 BAS | 28 days supply | Qty: 18 | Fill #0

## 2016-02-15 MED FILL — ?TRAZODONE 100 MG TABLET: 100 | 30 days supply | Qty: 30 | Fill #4

## 2016-02-15 MED FILL — LOSARTAN-HCTZ 100-12.5 MG T: 100-12.5 | 30 days supply | Qty: 30 | Fill #4

## 2016-04-07 ENCOUNTER — Telehealth: Payer: Self-pay | Admitting: Family Medicine

## 2016-04-07 NOTE — Telephone Encounter (Signed)
Pt calling requesting to speak to nurse about Symbicort Rx Pt would not disclose any further details

## 2016-04-10 ENCOUNTER — Encounter: Payer: Self-pay | Admitting: Family Medicine

## 2016-04-10 ENCOUNTER — Encounter (INDEPENDENT_AMBULATORY_CARE_PROVIDER_SITE_OTHER): Payer: Self-pay

## 2016-04-10 NOTE — Telephone Encounter (Signed)
Patient is following up regarding his message from last Friday regarding question about Symbicort and Asthma....   Pt. Would like to go into further details with CMA.

## 2016-04-11 ENCOUNTER — Encounter: Payer: Self-pay | Admitting: Family Medicine

## 2016-04-11 MED ORDER — BUDESONIDE-FORMOTEROL FUMARATE 160-4.5 MCG/ACT IN AERO: 2.0000 | INHALATION_SPRAY | Freq: Two times a day (BID) | RESPIRATORY_TRACT | 1 refills | Status: AC

## 2016-04-11 NOTE — Telephone Encounter (Signed)
Review of patient charts indicates Dr. Armen PickupFunches has addressed issue.  No further action needed at this time.

## 2016-04-13 ENCOUNTER — Encounter: Payer: Self-pay | Admitting: Family Medicine

## 2016-04-19 ENCOUNTER — Encounter: Payer: Self-pay | Admitting: Pharmacist

## 2016-04-19 NOTE — Progress Notes (Signed)
Completed paperwork from [employer] for Symbicort patient assistance program. Notified patient of completion.

## 2016-04-24 ENCOUNTER — Telehealth: Payer: Self-pay | Admitting: Family

## 2016-04-24 NOTE — Progress Notes (Signed)
Based on what you shared with me it looks like you have a serious condition that should be evaluated in a face to face office visit.  Joshua GrieveBryan, I reviewed your chart and it looks as if Dr. Armen PickupFunches faxed the prescription for Symbicort. I am unsure of what pharmacy you are using in [place], but advise you call his office tomorrow and get all your prescriptions sent to [place] until you can return home. You should not go without your blood pressure medication.   If you are having a true medical emergency please call 911.  If you need an urgent face to face visit, [place] has four urgent care centers for your convenience.  If you need care fast and have a high deductible or no insurance consider:   [email or web address]  [phone]  [address], [address] [place], [place] [address] 8 am to 8 pm Monday-Friday 10 am to 4 pm Saturday-Sunday   The following sites will take your  insurance:    . [facility]  [phone] Get Driving Directions Find a Provider at this Location  [address] [place], [place] [address] . 10 am to 8 pm Monday-Friday . 12 pm to 8 pm Saturday-Sunday   . [facility]  [phone] Get Driving Directions Find a Provider at this Location  [address] [place], [place] [address] . 8 am to 8 pm Monday-Friday . 9 am to 6 pm Saturday . 11 am to 6 pm Sunday   . [facility] [place]  [phone] Get Driving Directions  [id number] [place], [place] [address] . 8 am to 8 pm Monday-Friday . 8 am to 4 pm Saturday-Sunday   . [facility] (walk-ins welcome, or call for a scheduled time)  [phone]  Get Driving Directions Find a Provider at this Location  [address] [place], [place] [address] . 8 am to 8:30 pm Monday-Thursday . 8 am to 6 pm Friday . 8 am to 4 pm Saturday-Sunday   Your e-visit answers were  reviewed by a board certified advanced clinical practitioner to complete your personal care plan.  Thank you for using e-Visits.

## 2016-04-28 MED FILL — SPIRIVA 18 MCG CP-HANDIHALE: 18 | 30 days supply | Qty: 30 | Fill #5

## 2016-04-28 MED FILL — LOSARTAN-HCTZ 100-12.5 MG T: 100-12.5 | 30 days supply | Qty: 30 | Fill #5

## 2016-05-01 MED FILL — traZODone HCL 100 MG TABS: 100 | 30 days supply | Qty: 30 | Fill #5

## 2016-05-01 MED FILL — $VENTOLIN HFA 18G INHALER: 108 (90 BAS | 28 days supply | Qty: 18 | Fill #1

## 2016-05-29 ENCOUNTER — Other Ambulatory Visit: Payer: Self-pay | Admitting: Family Medicine

## 2016-05-29 DIAGNOSIS — G4700 Insomnia, unspecified: Secondary | ICD-10-CM

## 2016-05-29 DIAGNOSIS — I10 Essential (primary) hypertension: Secondary | ICD-10-CM

## 2016-07-11 ENCOUNTER — Other Ambulatory Visit: Payer: Self-pay | Admitting: Family Medicine

## 2016-07-11 DIAGNOSIS — I10 Essential (primary) hypertension: Secondary | ICD-10-CM
